# Patient Record
Sex: Male | Born: 1980 | Race: White | Hispanic: No | Marital: Single | Smoking: Current every day smoker
Health system: Southern US, Community
[De-identification: ages and names within clinical notes are randomized; demographics above are authoritative.]

## PROBLEM LIST (undated history)

## (undated) ENCOUNTER — Emergency Department: Admission: EM | Payer: Self-pay | Source: Home / Self Care

## (undated) DIAGNOSIS — B1920 Unspecified viral hepatitis C without hepatic coma: Secondary | ICD-10-CM

## (undated) DIAGNOSIS — F419 Anxiety disorder, unspecified: Secondary | ICD-10-CM

## (undated) HISTORY — DX: Unspecified viral hepatitis C without hepatic coma: B19.20

---

## 2005-08-04 ENCOUNTER — Emergency Department (HOSPITAL_COMMUNITY): Admission: EM | Admit: 2005-08-04 | Discharge: 2005-08-04 | Payer: Self-pay | Admitting: Emergency Medicine

## 2006-08-03 ENCOUNTER — Emergency Department: Payer: Self-pay | Admitting: Emergency Medicine

## 2007-06-09 ENCOUNTER — Emergency Department: Payer: Self-pay | Admitting: Emergency Medicine

## 2007-06-13 ENCOUNTER — Emergency Department: Payer: Self-pay | Admitting: Emergency Medicine

## 2009-05-05 ENCOUNTER — Emergency Department: Payer: Self-pay | Admitting: Emergency Medicine

## 2012-01-10 ENCOUNTER — Emergency Department: Payer: Self-pay | Admitting: Emergency Medicine

## 2013-08-05 ENCOUNTER — Emergency Department: Payer: Self-pay | Admitting: Emergency Medicine

## 2014-06-09 ENCOUNTER — Emergency Department: Payer: Self-pay | Admitting: Emergency Medicine

## 2014-06-09 LAB — DRUG SCREEN, URINE
Amphetamines, Ur Screen: NEGATIVE (ref ?–1000)
Barbiturates, Ur Screen: NEGATIVE (ref ?–200)
Benzodiazepine, Ur Scrn: POSITIVE (ref ?–200)
COCAINE METABOLITE, UR ~~LOC~~: POSITIVE (ref ?–300)
Cannabinoid 50 Ng, Ur ~~LOC~~: POSITIVE (ref ?–50)
MDMA (ECSTASY) UR SCREEN: NEGATIVE (ref ?–500)
Methadone, Ur Screen: NEGATIVE (ref ?–300)
OPIATE, UR SCREEN: POSITIVE (ref ?–300)
Phencyclidine (PCP) Ur S: NEGATIVE (ref ?–25)
TRICYCLIC, UR SCREEN: NEGATIVE (ref ?–1000)

## 2014-06-09 LAB — COMPREHENSIVE METABOLIC PANEL
ALBUMIN: 4 g/dL (ref 3.4–5.0)
ANION GAP: 10 (ref 7–16)
AST: 28 U/L (ref 15–37)
Alkaline Phosphatase: 110 U/L
BUN: 8 mg/dL (ref 7–18)
Bilirubin,Total: 0.4 mg/dL (ref 0.2–1.0)
Calcium, Total: 8.9 mg/dL (ref 8.5–10.1)
Chloride: 105 mmol/L (ref 98–107)
Co2: 21 mmol/L (ref 21–32)
Creatinine: 0.85 mg/dL (ref 0.60–1.30)
EGFR (African American): >60
EGFR (Non-African Amer.): >60
GLUCOSE: 93 mg/dL (ref 65–99)
Osmolality: 270 (ref 275–301)
Potassium: 3.6 mmol/L (ref 3.5–5.1)
SGPT (ALT): 21 U/L (ref 12–78)
Sodium: 136 mmol/L (ref 136–145)
Total Protein: 8 g/dL (ref 6.4–8.2)

## 2014-06-09 LAB — CBC
HCT: 49.6 % (ref 40.0–52.0)
HGB: 17.1 g/dL (ref 13.0–18.0)
MCH: 33 pg (ref 26.0–34.0)
MCHC: 34.6 g/dL (ref 32.0–36.0)
MCV: 95 fL (ref 80–100)
Platelet: 328 10*3/uL (ref 150–440)
RBC: 5.19 10*6/uL (ref 4.40–5.90)
RDW: 13.7 % (ref 11.5–14.5)
WBC: 10.2 10*3/uL (ref 3.8–10.6)

## 2014-06-09 LAB — URINALYSIS, COMPLETE
BILIRUBIN, UR: NEGATIVE
Bacteria: NONE SEEN
Blood: NEGATIVE
GLUCOSE, UR: NEGATIVE mg/dL (ref 0–75)
KETONE: NEGATIVE
Leukocyte Esterase: NEGATIVE
NITRITE: NEGATIVE
PROTEIN: NEGATIVE
Ph: 5 (ref 4.5–8.0)
RBC,UR: <1 /HPF (ref 0–5)
SPECIFIC GRAVITY: 1.016 (ref 1.003–1.030)
Squamous Epithelial: <1

## 2014-06-09 LAB — SALICYLATE LEVEL: Salicylates, Serum: 4.6 mg/dL — ABNORMAL HIGH

## 2014-06-09 LAB — ETHANOL
ETHANOL %: 0.026 % (ref 0.000–0.080)
Ethanol: 26 mg/dL

## 2014-06-09 LAB — ACETAMINOPHEN LEVEL: Acetaminophen: 5 ug/mL — ABNORMAL LOW

## 2015-03-30 ENCOUNTER — Emergency Department
Admit: 2015-03-30 | Disposition: A | Discharge status: Home / Self Care | Payer: Self-pay | Admitting: Emergency Medicine

## 2015-03-30 LAB — COMPREHENSIVE METABOLIC PANEL
ALBUMIN: 4.4 g/dL
ALT: 23 U/L
AST: 24 U/L
Alkaline Phosphatase: 92 U/L
Anion Gap: 10 (ref 7–16)
BILIRUBIN TOTAL: 0.6 mg/dL
BUN: 9 mg/dL
Calcium, Total: 9.5 mg/dL
Chloride: 101 mmol/L
Co2: 26 mmol/L
Creatinine: 0.91 mg/dL
EGFR (African American): >60
EGFR (Non-African Amer.): >60
Glucose: 83 mg/dL
POTASSIUM: 3.5 mmol/L
Sodium: 137 mmol/L
TOTAL PROTEIN: 8.1 g/dL

## 2015-03-30 LAB — CBC
HCT: 51.7 % (ref 40.0–52.0)
HGB: 17.5 g/dL (ref 13.0–18.0)
MCH: 31.5 pg (ref 26.0–34.0)
MCHC: 33.8 g/dL (ref 32.0–36.0)
MCV: 93 fL (ref 80–100)
PLATELETS: 366 10*3/uL (ref 150–440)
RBC: 5.56 10*6/uL (ref 4.40–5.90)
RDW: 13.1 % (ref 11.5–14.5)
WBC: 11.9 10*3/uL — ABNORMAL HIGH (ref 3.8–10.6)

## 2015-03-30 LAB — ACETAMINOPHEN LEVEL

## 2015-03-30 LAB — ETHANOL: Ethanol: <5 mg/dL

## 2015-03-30 LAB — SALICYLATE LEVEL: Salicylates, Serum: <4 mg/dL

## 2015-03-30 LAB — TROPONIN I: Troponin-I: <0.03 ng/mL

## 2016-04-30 ENCOUNTER — Encounter: Payer: Self-pay | Admitting: Medical Oncology

## 2016-04-30 ENCOUNTER — Inpatient Hospital Stay
Admission: RE | Admit: 2016-04-30 | Discharge: 2016-05-03 | DRG: 897 | Disposition: A | Discharge status: Home / Self Care | Payer: Self-pay | Source: Intra-hospital | Attending: Psychiatry | Admitting: Psychiatry

## 2016-04-30 ENCOUNTER — Emergency Department
Admission: EM | Admit: 2016-04-30 | Discharge: 2016-04-30 | Disposition: A | Discharge status: Home / Self Care | Payer: Self-pay | Attending: Emergency Medicine | Admitting: Emergency Medicine

## 2016-04-30 DIAGNOSIS — F1990 Other psychoactive substance use, unspecified, uncomplicated: Secondary | ICD-10-CM | POA: Insufficient documentation

## 2016-04-30 DIAGNOSIS — R45851 Suicidal ideations: Secondary | ICD-10-CM | POA: Insufficient documentation

## 2016-04-30 DIAGNOSIS — F17210 Nicotine dependence, cigarettes, uncomplicated: Secondary | ICD-10-CM | POA: Diagnosis present

## 2016-04-30 DIAGNOSIS — F1410 Cocaine abuse, uncomplicated: Secondary | ICD-10-CM | POA: Insufficient documentation

## 2016-04-30 DIAGNOSIS — F1120 Opioid dependence, uncomplicated: Secondary | ICD-10-CM

## 2016-04-30 DIAGNOSIS — F419 Anxiety disorder, unspecified: Secondary | ICD-10-CM | POA: Diagnosis present

## 2016-04-30 DIAGNOSIS — F1420 Cocaine dependence, uncomplicated: Secondary | ICD-10-CM

## 2016-04-30 DIAGNOSIS — F1424 Cocaine dependence with cocaine-induced mood disorder: Principal | ICD-10-CM | POA: Diagnosis present

## 2016-04-30 DIAGNOSIS — F17200 Nicotine dependence, unspecified, uncomplicated: Secondary | ICD-10-CM

## 2016-04-30 DIAGNOSIS — F3289 Other specified depressive episodes: Secondary | ICD-10-CM

## 2016-04-30 DIAGNOSIS — F1994 Other psychoactive substance use, unspecified with psychoactive substance-induced mood disorder: Secondary | ICD-10-CM

## 2016-04-30 DIAGNOSIS — F1320 Sedative, hypnotic or anxiolytic dependence, uncomplicated: Secondary | ICD-10-CM

## 2016-04-30 DIAGNOSIS — Z818 Family history of other mental and behavioral disorders: Secondary | ICD-10-CM

## 2016-04-30 HISTORY — DX: Anxiety disorder, unspecified: F41.9

## 2016-04-30 LAB — COMPREHENSIVE METABOLIC PANEL
ALK PHOS: 89 U/L (ref 38–126)
ALT: 34 U/L (ref 17–63)
AST: 32 U/L (ref 15–41)
Albumin: 4.6 g/dL (ref 3.5–5.0)
Anion gap: 8 (ref 5–15)
BILIRUBIN TOTAL: 0.6 mg/dL (ref 0.3–1.2)
BUN: 6 mg/dL (ref 6–20)
CALCIUM: 9.3 mg/dL (ref 8.9–10.3)
CO2: 26 mmol/L (ref 22–32)
Chloride: 105 mmol/L (ref 101–111)
Creatinine, Ser: 0.74 mg/dL (ref 0.61–1.24)
GFR calc non Af Amer: >60 mL/min (ref >60)
Glucose, Bld: 90 mg/dL (ref 65–99)
Potassium: 4.3 mmol/L (ref 3.5–5.1)
Sodium: 139 mmol/L (ref 135–145)
TOTAL PROTEIN: 7.6 g/dL (ref 6.5–8.1)

## 2016-04-30 LAB — URINE DRUG SCREEN, QUALITATIVE (ARMC ONLY)
Amphetamines, Ur Screen: NOT DETECTED
BARBITURATES, UR SCREEN: NOT DETECTED
Benzodiazepine, Ur Scrn: POSITIVE — AB
CANNABINOID 50 NG, UR ~~LOC~~: NOT DETECTED
Cocaine Metabolite,Ur ~~LOC~~: POSITIVE — AB
MDMA (Ecstasy)Ur Screen: NOT DETECTED
Methadone Scn, Ur: NOT DETECTED
OPIATE, UR SCREEN: POSITIVE — AB
Phencyclidine (PCP) Ur S: NOT DETECTED
TRICYCLIC, UR SCREEN: NOT DETECTED

## 2016-04-30 LAB — SALICYLATE LEVEL

## 2016-04-30 LAB — CBC
HCT: 44.1 % (ref 40.0–52.0)
Hemoglobin: 14.9 g/dL (ref 13.0–18.0)
MCH: 30.6 pg (ref 26.0–34.0)
MCHC: 33.7 g/dL (ref 32.0–36.0)
MCV: 90.9 fL (ref 80.0–100.0)
Platelets: 280 10*3/uL (ref 150–440)
RBC: 4.85 MIL/uL (ref 4.40–5.90)
RDW: 13.9 % (ref 11.5–14.5)
WBC: 7.7 10*3/uL (ref 3.8–10.6)

## 2016-04-30 LAB — ETHANOL: Alcohol, Ethyl (B): <5 mg/dL (ref <5)

## 2016-04-30 LAB — ACETAMINOPHEN LEVEL: Acetaminophen (Tylenol), Serum: <10 ug/mL — ABNORMAL LOW (ref 10–30)

## 2016-04-30 MED ORDER — ACETAMINOPHEN 325 MG PO TABS
650.0000 mg | ORAL_TABLET | Freq: Four times a day (QID) | ORAL | Status: DC | PRN
  Administered 2016-05-01 – 2016-05-02 (×2): 650 mg via ORAL
  Filled 2016-04-30 (×2): qty 2

## 2016-04-30 MED ORDER — BUSPIRONE HCL 5 MG PO TABS
5.0000 mg | ORAL_TABLET | Freq: Three times a day (TID) | ORAL | Status: DC
  Administered 2016-05-01 – 2016-05-03 (×6): 5 mg via ORAL
  Filled 2016-04-30 (×7): qty 1

## 2016-04-30 MED ORDER — ALUM & MAG HYDROXIDE-SIMETH 200-200-20 MG/5ML PO SUSP: 30.0000 mL | ORAL | Status: DC | PRN

## 2016-04-30 MED ORDER — BUSPIRONE HCL 10 MG PO TABS
5.0000 mg | ORAL_TABLET | Freq: Three times a day (TID) | ORAL | Status: DC
  Administered 2016-04-30: 5 mg via ORAL
  Filled 2016-04-30: qty 2

## 2016-04-30 MED ORDER — MAGNESIUM HYDROXIDE 400 MG/5ML PO SUSP: 30.0000 mL | Freq: Every day | ORAL | Status: DC | PRN

## 2016-04-30 MED ORDER — LORAZEPAM 1 MG PO TABS
1.0000 mg | ORAL_TABLET | Freq: Once | ORAL | Status: AC
  Administered 2016-04-30: 1 mg via ORAL
  Filled 2016-04-30: qty 1

## 2016-04-30 NOTE — ED Provider Notes (Signed)
Time Seen: Approximately *1420 I have reviewed the triage notes  Chief Complaint: Suicidal   History of Present Illness: Richard Pierce is a 35 y.o. male *who states he's been using cocaine now for the last 9 days. He also states marijuana, methamphentermine, alcohol usage, etc. Apparently police were notified after the patient was found unresponsive on a porch. He stated to the police to the officers that he felt suicidal and tells me that his plan was to kill himself by using the multiple substances. His only physical complaint is he feels anxious and jittery. He states last time he used cocaine was this morning. He denies any chest pain or shortness of breath. He states he has had some visual hallucinations while using the polysubstances but none at present. He denies any homicidal thoughts. He denies any previous history of suicidal ideation Past Medical History  Diagnosis Date  . Anxiety     Patient Active Problem List   Diagnosis Date Noted  . Suicidal ideation 04/30/2016  . Substance induced mood disorder (HCC) 04/30/2016  . Cocaine abuse 04/30/2016  . Anxiety disorder 04/30/2016    History reviewed. No pertinent past surgical history.  History reviewed. No pertinent past surgical history.  No current outpatient prescriptions on file.  Allergies:  Review of patient's allergies indicates no known allergies.  Family History: No family history on file.  Social History: Social History  Substance Use Topics  . Smoking status: Current Every Day Smoker  . Smokeless tobacco: None  . Alcohol Use: Yes     Comment: last drink 4 hrs ago     Review of Systems:   10 point review of systems was performed and was otherwise negative:  Constitutional: No fever Eyes: No visual disturbances ENT: No sore throat, ear pain Cardiac: No chest pain Respiratory: No shortness of breath, wheezing, or stridor Abdomen: No abdominal pain, no vomiting, No diarrhea Endocrine: No weight  loss, No night sweats Extremities: No peripheral edema, cyanosis Skin: No rashes, easy bruising Neurologic: No focal weakness, trouble with speech or swollowing Urologic: No dysuria, Hematuria, or urinary frequency *  Physical Exam:  ED Triage Vitals  Enc Vitals Group     BP 04/30/16 1358 137/90 mmHg     Pulse Rate 04/30/16 1358 68     Resp 04/30/16 1358 18     Temp 04/30/16 1358 97.5 F (36.4 C)     Temp Source 04/30/16 1358 Oral     SpO2 04/30/16 1358 99 %     Weight 04/30/16 1358 175 lb (79.379 kg)     Height 04/30/16 1358  (1.626 m)     Head Cir --      Peak Flow --      Pain Score --      Pain Loc --      Pain Edu? --      Excl. in GC? --     General: Awake , Alert , and Oriented times 3; GCS 15 Head: Normal cephalic , atraumatic Eyes: Pupils equal , round, reactive to light Nose/Throat: No nasal drainage, patent upper airway without erythema or exudate.  Neck: Supple, Full range of motion, No anterior adenopathy or palpable thyroid masses Lungs: Clear to ascultation without wheezes , rhonchi, or rales Heart: Regular rate, regular rhythm without murmurs , gallops , or rubs Abdomen: Soft, non tender without rebound, guarding , or rigidity; bowel sounds positive and symmetric in all 4 quadrants. No organomegaly .  Extremities: 2 plus symmetric pulses. No edema, clubbing or cyanosis Neurologic: normal ambulation, Motor symmetric without deficits, sensory intact Skin: warm, dry, no rashes   Labs:   All laboratory work was reviewed including any pertinent negatives or positives listed below:  Labs Reviewed  ACETAMINOPHEN LEVEL - Abnormal; Notable for the following:    Acetaminophen (Tylenol), Serum <10 (*)    All other components within normal limits  URINE DRUG SCREEN, QUALITATIVE ([facility] ONLY) - Abnormal; Notable for the following:    Cocaine Metabolite,Ur [place] POSITIVE (*)    Opiate, Ur Screen POSITIVE (*)    Benzodiazepine, Ur Scrn POSITIVE (*)    All  other components within normal limits  COMPREHENSIVE METABOLIC PANEL  ETHANOL  SALICYLATE LEVEL  CBC  Labs were positive for multiple substances  E   ED Course:  Patient has consultation requested of TTS and psychiatric services. I will involuntarily commit him until cleared by psychiatry. He was given Ativan for his "" jitteriness "" but was cautioned that we would be substituting one substance for another and that we would likely reliable and the psychiatrist for further guidance with medications. Patient appears to be of understanding.    Assessment:  Suicidal ideation Polysubstance abuse      Plan:  Patient has consultation with TTS and psychiatry.            Jennye MoccasinBrian S Graciella Arment, MD 04/30/16 [phone]

## 2016-04-30 NOTE — ED Notes (Signed)
MD at bedside. 

## 2016-04-30 NOTE — ED Notes (Signed)
IVC 

## 2016-04-30 NOTE — Consult Note (Signed)
[facility]iat[facility] Consult   Reason for Consult:  Consult for this 35 year old man brought in by law enforcement taking suicidal statements Referring Physician:  Marcelene Butte Patient Identification: Richard Pierce MRN:  [id number] Principal Diagnosis: Substance induced mood disorder [facility]) Diagnosis:   Patient Active Problem List   Diagnosis Date Noted  . Suicidal ideation [R45.851] 04/30/2016  . Substance induced mood disorder ([place]) [F19.94] 04/30/2016  . Cocaine abuse [F14.10] 04/30/2016  . Anxiety disorder [F41.9] 04/30/2016    Total Time spent with patient: 1 hour  Subjective:   Richard Pierce is a 35 y.o. male patient admitted with "I just feel like giving up".  HPI:  Patient interviewed. Chart reviewed. Labs and vitals reviewed. Case discussed with ER physician and TTS. Patient was brought in by law enforcement after being found passed out on a stranger's porch. Patient has made suicidal statements here in the ER. On interview with me the patient says he is feeling exhausted and feeling like giving up. He says that he has not slept in days and hasn't eaten in several days. His mood feels anxious depressed and hopeless. He has been using crack cocaine and probably other drugs for several days. He can't remember a lot of it. He says that when he got out of jail a couple weeks ago he realized that he had lost everything in his life. Girlfriend was gone, she had taken the children with her, job and home were gone. He got overwhelmed with despair and felt hopeless. He's been on this drug binge for several days. He is not taking psychiatric medicine the way he had in the past. He reports having had some hallucinations while he was intoxicated on cocaine. Patient says that he took 1 Xanax and can't remember any other drugs although there are multiple positive drugs in his screening.  Social history: Not currently working. Hasn't found a job since getting out of jail. Living with his  grandparents. Presents himself as having little social support.  Medical history: Patient denies having any ongoing known medical problems  Substance abuse history: He says he really didn't have much of a problem with drugs until just this last couple weeks. Claims that he hadn't used drugs before going into the jail or at least not in abusive way. He says he used to be prescribed Xanax regularly but was taken off of that while he was in jail. Currently came in with multiple things positive in his drug screen.    Past Psychiatric History: Patient says that for several years he was treated with 2 mg of Xanax 3 times a day starting years ago when he was living in [place]. In jail of course they did not continue the Xanax but instead put him on BuSpar which she also thought was helpful. He describes chronic anxiety and intermittent feelings of panic and feeling overwhelmed easily. Denies ever having tried to kill himself other than this recent allegedly attempted denies any history of psychiatric hospitalization.  Risk to Self: Is patient at risk for suicide?: Yes Risk to Others:   Prior Inpatient Therapy:   Prior Outpatient Therapy:    Past Medical History:  Past Medical History  Diagnosis Date  . Anxiety    History reviewed. No pertinent past surgical history. Family History: No family history on file. Family Psychiatric  History: Patient denies knowing of any family history of mental health or substance abuse problems  Social History:  History  Alcohol Use  . Yes  Comment: last drink 4 hrs ago     History  Drug Use  . Yes  . Special: IV, Cocaine    Social History   Social History  . Marital Status: Single    Spouse Name: N/A  . Number of Children: N/A  . Years of Education: N/A   Social History Main Topics  . Smoking status: Current Every Day Smoker  . Smokeless tobacco: None  . Alcohol Use: Yes     Comment: last drink 4 hrs ago  . Drug Use: Yes    Special: IV, Cocaine   . Sexual Activity: Not Asked   Other Topics Concern  . None   Social History Narrative  . None   Additional Social History:    Allergies:  No Known Allergies  Labs:  Results for orders placed or performed during the hospital encounter of 04/30/16 (from the past 48 hour(s))  Comprehensive metabolic panel     Status: None   Collection Time: 04/30/16  2:04 PM  Result Value Ref Range   Sodium 139 135 - 145 mmol/L   Potassium 4.3 3.5 - 5.1 mmol/L   Chloride 105 101 - 111 mmol/L   CO2 26 22 - 32 mmol/L   Glucose, Bld 90 65 - 99 mg/dL   BUN 6 6 - 20 mg/dL   Creatinine, Ser 0.74 0.61 - 1.24 mg/dL   Calcium 9.3 8.9 - 10.3 mg/dL   Total Protein 7.6 6.5 - 8.1 g/dL   Albumin 4.6 3.5 - 5.0 g/dL   AST 32 15 - 41 U/L   ALT 34 17 - 63 U/L   Alkaline Phosphatase 89 38 - 126 U/L   Total Bilirubin 0.6 0.3 - 1.2 mg/dL   GFR calc non Af Amer >60 >60 mL/min   GFR calc Af Amer >60 >60 mL/min    Comment: (NOTE) The eGFR has been calculated using the CKD EPI equation. This calculation has not been validated in all clinical situations. eGFR's persistently <60 mL/min signify possible Chronic Kidney Disease.    Anion gap 8 5 - 15  Ethanol     Status: None   Collection Time: 04/30/16  2:04 PM  Result Value Ref Range   Alcohol, Ethyl (B) <5 <5 mg/dL    Comment:        LOWEST DETECTABLE LIMIT FOR SERUM ALCOHOL IS 5 mg/dL FOR MEDICAL PURPOSES ONLY   Salicylate level     Status: None   Collection Time: 04/30/16  2:04 PM  Result Value Ref Range   Salicylate Lvl <2.1 2.8 - 30.0 mg/dL  Acetaminophen level     Status: Abnormal   Collection Time: 04/30/16  2:04 PM  Result Value Ref Range   Acetaminophen (Tylenol), Serum <10 (L) 10 - 30 ug/mL    Comment:        THERAPEUTIC CONCENTRATIONS VARY SIGNIFICANTLY. A RANGE OF 10-30 ug/mL MAY BE AN EFFECTIVE CONCENTRATION FOR MANY PATIENTS. HOWEVER, SOME ARE BEST TREATED AT CONCENTRATIONS OUTSIDE THIS RANGE. ACETAMINOPHEN CONCENTRATIONS >150  ug/mL AT 4 HOURS AFTER INGESTION AND >50 ug/mL AT 12 HOURS AFTER INGESTION ARE OFTEN ASSOCIATED WITH TOXIC REACTIONS.   cbc     Status: None   Collection Time: 04/30/16  2:04 PM  Result Value Ref Range   WBC 7.7 3.8 - 10.6 K/uL   RBC 4.85 4.40 - 5.90 MIL/uL   Hemoglobin 14.9 13.0 - 18.0 g/dL   HCT 44.1 40.0 - 52.0 %   MCV 90.9 80.0 - 100.0 fL  MCH 30.6 26.0 - 34.0 pg   MCHC 33.7 32.0 - 36.0 g/dL   RDW 13.9 11.5 - 14.5 %   Platelets 280 150 - 440 K/uL  Urine Drug Screen, Qualitative     Status: Abnormal   Collection Time: 04/30/16  2:04 PM  Result Value Ref Range   Tricyclic, Ur Screen NONE DETECTED NONE DETECTED   Amphetamines, Ur Screen NONE DETECTED NONE DETECTED   MDMA (Ecstasy)Ur Screen NONE DETECTED NONE DETECTED   Cocaine Metabolite,Ur [place] POSITIVE (A) NONE DETECTED   Opiate, Ur Screen POSITIVE (A) NONE DETECTED   Phencyclidine (PCP) Ur S NONE DETECTED NONE DETECTED   Cannabinoid 50 Ng, Ur [place] NONE DETECTED NONE DETECTED   Barbiturates, Ur Screen NONE DETECTED NONE DETECTED   Benzodiazepine, Ur Scrn POSITIVE (A) NONE DETECTED   Methadone Scn, Ur NONE DETECTED NONE DETECTED    Comment: (NOTE) 220  Tricyclics, urine               Cutoff 1000 ng/mL 200  Amphetamines, urine             Cutoff 1000 ng/mL 300  MDMA (Ecstasy), urine           Cutoff 500 ng/mL 400  Cocaine Metabolite, urine       Cutoff 300 ng/mL 500  Opiate, urine                   Cutoff 300 ng/mL 600  Phencyclidine (PCP), urine      Cutoff 25 ng/mL 700  Cannabinoid, urine              Cutoff 50 ng/mL 800  Barbiturates, urine             Cutoff 200 ng/mL 900  Benzodiazepine, urine           Cutoff 200 ng/mL 1000 Methadone, urine                Cutoff 300 ng/mL 1100 1200 The urine drug screen provides only a preliminary, unconfirmed 1300 analytical test result and should not be used for non-medical 1400 purposes. Clinical consideration and professional judgment should 1500 be applied to any positive drug  screen result due to possible 1600 interfering substances. A more specific alternate chemical method 1700 must be used in order to obtain a confirmed analytical result.  1800 Gas chromato graphy / mass spectrometry (GC/MS) is the preferred 1900 confirmatory method.     Current Facility-Administered Medications  Medication Dose Route Frequency Provider Last Rate Last Dose  . busPIRone (BUSPAR) tablet 5 mg  5 mg Oral TID Gonzella Lex, MD       No current outpatient prescriptions on file.    Musculoskeletal: Strength & Muscle Tone: within normal limits Gait & Station: normal Patient leans: N/A  Psychiatric Specialty Exam: Review of Systems  Constitutional: Positive for malaise/fatigue.  HENT: Negative.   Eyes: Negative.   Respiratory: Negative.   Cardiovascular: Negative.   Gastrointestinal: Negative.   Musculoskeletal: Positive for myalgias.  Skin: Negative.   Neurological: Negative.   Psychiatric/Behavioral: Positive for depression, suicidal ideas, hallucinations, memory loss and substance abuse. The patient is nervous/anxious and has insomnia.     Blood pressure 137/90, pulse 68, temperature 97.5 F (36.4 C), temperature source Oral, resp. rate 18, height _0  (1.626 m), weight 79.379 kg (175 lb), SpO2 99 %.Body mass index is 30.02 kg/(m^2).  General Appearance: Disheveled  Eye Contact::  Minimal  Speech:  Slow and Slurred  Volume:  Decreased  Mood:  Depressed  Affect:  Depressed  Thought Process:  Tangential  Orientation:  Full (Time, Place, and Person)  Thought Content:  Negative  Suicidal Thoughts:  Yes.  with intent/plan  Homicidal Thoughts:  No  Memory:  Immediate;   Fair Recent;   Poor Remote;   Fair  Judgement:  Impaired  Insight:  Fair  Psychomotor Activity:  Decreased  Concentration:  Poor  Recall:  Poor  Fund of Knowledge:Fair  Language: Fair  Akathisia:  No  Handed:  Right  AIMS (if indicated):     Assets:  Desire for Improvement Physical  Health  ADL's:  Intact  Cognition: Impaired,  Mild  Sleep:      Treatment Plan Summary: Daily contact with patient to assess and evaluate symptoms and progress in treatment, Medication management and Plan 35 year old man who comes in to the hospital intoxicated but also with suicidal ideation. I was able to wake him up enough to have a conversation and he does endorse feeling sad and negative much of the time. Hopeless. Wishing he were dead. Not able to really think of a clear plan for the future. Lots of major psychological stresses on him. I will go ahead and admit him to the hospital. Restart BuSpar 5 mg 3 times a day for anxiety. Continuous observation for now for suicidal ideation. Try and engage him in appropriate therapy groups and get a better assessment of him once he is sober and away  Disposition: Recommend psychiatric Inpatient admission when medically cleared. Supportive therapy provided about ongoing stressors.  Alethia Berthold, MD 04/30/2016 3:59 PM

## 2016-04-30 NOTE — BH Assessment (Signed)
Assessment Note  Richard Pierce is an 35 y.o. male Who presents to the ER due to depression and suicide attempt. He states he took "a bunch of drugs" with the hopes of overdosing and ending his life. Patient was recently incarcerated and was released two weeks ago. Since then, he hasn't worked and hasn't had stable housing. Thus, causing his depression to worsen. Thus, the suicide attempt on last night was a result of his current situation.  During the interview, the patient was drowsy and lethargic. Writer had to wake him up several times, in order to complete the assessment.  Patient continue to endorse SI. He denies HI and AV/H.   Past Medical History:  Past Medical History  Diagnosis Date  . Anxiety     History reviewed. No pertinent past surgical history.  Family History: No family history on file.  Social History:  reports that he has been smoking.  He does not have any smokeless tobacco history on file. He reports that he drinks alcohol. He reports that he uses illicit drugs (IV and Cocaine).  Additional Social History:  Alcohol / Drug Use Pain Medications: See PTA Prescriptions: See PTA Over the Counter: See PTA History of alcohol / drug use?: Yes Longest period of sobriety (when/how long): Unknown Negative Consequences of Use: Personal relationships, Legal, Financial Withdrawal Symptoms:  (Uknown at this time)  CIWA: CIWA-Ar BP: 137/90 mmHg Pulse Rate: 68 COWS:    Allergies: No Known Allergies  Home Medications:  (Not in a hospital admission)  OB/GYN Status:  No LMP for male patient.  General Assessment Data Location of Assessment: [facility] ED TTS Assessment: In system Is this a Tele or Face-to-Face Assessment?: Face-to-Face Is this an Initial Assessment or a Re-assessment for this encounter?: Initial Assessment Marital status: Single Maiden name: n/a Is patient pregnant?: No Pregnancy Status: No Living Arrangements: Other (Comment) (Homeless) Can pt return  to current living arrangement?: Yes Admission Status: Voluntary Is patient capable of signing voluntary admission?: Yes Referral Source: Self/Family/Friend Insurance type: n/a  Medical Screening Exam [facility] Walk-in ONLY) Medical Exam completed: Yes  Crisis Care Plan Living Arrangements: Other (Comment) (Homeless) Legal Guardian: Other: (None) Name of Psychiatrist: Reports of none Name of Therapist: Reports of none  Education Status Is patient currently in school?: No Current Grade: n/a Highest grade of school patient has completed: Unknown Name of school: n/a Contact person: n/a  Risk to self with the past 6 months Suicidal Ideation: Yes-Currently Present Has patient been a risk to self within the past 6 months prior to admission? : Yes Suicidal Intent: Yes-Currently Present Has patient had any suicidal intent within the past 6 months prior to admission? : Yes Is patient at risk for suicide?: Yes Suicidal Plan?: Yes-Currently Present Has patient had any suicidal plan within the past 6 months prior to admission? : Yes Specify Current Suicidal Plan: Overdose Access to Means: Yes Specify Access to Suicidal Means: Drugs What has been your use of drugs/alcohol within the last 12 months?: Cocaine, Benzo's and Opioids Previous Attempts/Gestures: Yes How many times?: 1 Other Self Harm Risks: Active Addiction Triggers for Past Attempts: None known Intentional Self Injurious Behavior: None Family Suicide History: No Recent stressful life event(s): Other (Comment), Conflict (Comment), Financial Problems, Job Loss (Active Addiction ) Persecutory voices/beliefs?: No Depression: Yes Depression Symptoms: Feeling angry/irritable, Feeling worthless/self pity, Loss of interest in usual pleasures, Isolating, Guilt, Fatigue Substance abuse history and/or treatment for substance abuse?: Yes Suicide prevention information given to non-admitted patients: Not  applicable  Risk to Others within  the past 6 months Homicidal Ideation: No Does patient have any lifetime risk of violence toward others beyond the six months prior to admission? : No Thoughts of Harm to Others: No Current Homicidal Intent: No Current Homicidal Plan: No Access to Homicidal Means: No Identified Victim: Reports of none History of harm to others?: No Assessment of Violence: None Noted Violent Behavior Description: Reports of none Does patient have access to weapons?: No Criminal Charges Pending?: No Does patient have a court date: No Is patient on probation?: No  Psychosis Hallucinations: None noted Delusions: None noted  Mental Status Report Appearance/Hygiene: Unremarkable, In scrubs, In hospital gown Eye Contact: Fair Motor Activity: Freedom of movement, Unremarkable Speech: Logical/coherent, Unremarkable Level of Consciousness: Drowsy Mood: Depressed, Anxious, Sad, Helpless, Pleasant Affect: Sad, Depressed, Appropriate to circumstance Anxiety Level: Minimal Thought Processes: Coherent, Relevant Judgement: Impaired Orientation: Place, Person, Situation, Time, Appropriate for developmental age Obsessive Compulsive Thoughts/Behaviors: None  Cognitive Functioning Concentration: Decreased Memory: Recent Intact, Remote Intact IQ: Average Insight: Fair Impulse Control: Poor Appetite: Fair Weight Loss: 10 Weight Gain: 0 Sleep: No Change Total Hours of Sleep: 5 Vegetative Symptoms: None  ADLScreening [facility] Assessment Services) Patient's cognitive ability adequate to safely complete daily activities?: Yes Patient able to express need for assistance with ADLs?: Yes Independently performs ADLs?: Yes (appropriate for developmental age)  Prior Inpatient Therapy Prior Inpatient Therapy: No Prior Therapy Dates: Reports of none Prior Therapy Facilty/Provider(s): Reports of none Reason for Treatment: Reports of none  Prior Outpatient Therapy Prior Outpatient Therapy: No Prior Therapy Dates:  Reports of none Prior Therapy Facilty/Provider(s): Reports of none Reason for Treatment: Reports of none Does patient have an ACCT team?: No Does patient have Intensive In-House Services?  : No Does patient have [facility] services? : No Does patient have P4CC services?: No  ADL Screening (condition at time of admission) Patient's cognitive ability adequate to safely complete daily activities?: Yes Is the patient deaf or have difficulty hearing?: No Does the patient have difficulty seeing, even when wearing glasses/contacts?: No Does the patient have difficulty concentrating, remembering, or making decisions?: No Patient able to express need for assistance with ADLs?: Yes Does the patient have difficulty dressing or bathing?: No Independently performs ADLs?: Yes (appropriate for developmental age) Does the patient have difficulty walking or climbing stairs?: No Weakness of Legs: None Weakness of Arms/Hands: None  Home Assistive Devices/Equipment Home Assistive Devices/Equipment: None  Therapy Consults (therapy consults require a physician order) PT Evaluation Needed: No OT Evalulation Needed: No SLP Evaluation Needed: No Abuse/Neglect Assessment (Assessment to be complete while patient is alone) Physical Abuse: Denies Verbal Abuse: Denies Sexual Abuse: Denies Exploitation of patient/patient's resources: Denies Self-Neglect: Denies Values / Beliefs Cultural Requests During Hospitalization: None Spiritual Requests During Hospitalization: None Consults Spiritual Care Consult Needed: No Social Work Consult Needed: No      Additional Information 1:1 In Past 12 Months?: No CIRT Risk: No Elopement Risk: No Does patient have medical clearance?: Yes  Child/Adolescent Assessment Running Away Risk: Denies (Patient is an adult)  Disposition:  Disposition Initial Assessment Completed for this Encounter: Yes Disposition of Patient: Other dispositions (ER MD Ordered Psych  Consult) Other disposition(s): Other (Comment) (ER MD Ordered Psych Consult)  On Site Evaluation by:   Reviewed with Physician:    Lilyan Gilfordalvin J. Lillian Ballester MS, LCAS, LPC, NCC, CCSI Therapeutic Triage Specialist 04/30/2016 4:35 PM

## 2016-04-30 NOTE — ED Notes (Signed)
Pt reports he has been using cocaine x 9 days with intents to kill himself. Pt also reports he has been drinking and taking pills. Pt denies HI.

## 2016-04-30 NOTE — [facility] Counselor (Signed)
Patient has been accepted [facility]. Accepting Physician is Dr. Toni Amendlapacs. Attending physician will be Dr. Ardyth HarpsHernandez. [facility] Nurse [place] Call report to [phone]

## 2016-05-01 DIAGNOSIS — F1994 Other psychoactive substance use, unspecified with psychoactive substance-induced mood disorder: Secondary | ICD-10-CM

## 2016-05-01 NOTE — [facility] Group Notes (Signed)
[facility] LCSW Group Therapy  05/01/2016 2:14 PM  Type of Therapy:  Group Therapy  Participation Level:  Did Not Attend  Modes of Intervention:  Discussion, Education, Socialization and Support  Summary of Progress/Problems: Balance in life: Patients will discuss the concept of balance and how it looks and feels to be unbalanced. Pt will identify areas in their life that is unbalanced and ways to become more balanced.    Assia Meanor L Hibo Blasdell MSW, LCSWA  05/01/2016, 2:14 PM  

## 2016-05-01 NOTE — Plan of Care (Signed)
Problem: Diagnosis: Increased Risk For Suicide Attempt Goal: LTG-Patient Will Report Improved Mood and Deny Suicidal LTG (by discharge) Patient will report improved mood and deny suicidal ideation.  Outcome: Progressing Patient denies SI     

## 2016-05-01 NOTE — [facility] Counselor (Signed)
Adult Comprehensive Assessment  Patient ID: Richard Pierce, male   DOB: 07-May-1981, 35 y.o.   MRN: [id number]  Information Source: Information source: Patient  Current Stressors:  Educational / Learning stressors: None reported  Employment / Job issues: Pt is currently unemployed  Family Relationships: Strained relationship with family.  Financial / Lack of resources (include bankruptcy): No income.  Housing / Lack of housing: Pt is currently homeless.  Physical health (include injuries & life threatening diseases): None reported  Social relationships: None reported  Substance abuse: Pt reports using $400-$500 worth of Cocaine per day along with a 5th of liquor and 18 beers.   Living/Environment/Situation:  Living Arrangements: Other relatives (Grandmother ) Living conditions (as described by patient or guardian): Pt reports he cannot return.  How long has patient lived in current situation?: 3 weeks. Prior to this he was in prison.  What is atmosphere in current home: Temporary, Chaotic  Family History:  Marital status: Single Are you sexually active?: Yes What is your sexual orientation?: Heterosexual  Has your sexual activity been affected by drugs, alcohol, medication, or emotional stress?: None reported  Does patient have children?: Yes How many children?: 2 How is patient's relationship with their children?: son and daugher; close relationship.   Childhood History:  By whom was/is the patient raised?: Other (Comment) (Great grand parents ) Additional childhood history information: Parents were not involved in childhood.  Description of patient's relationship with caregiver when they were a child: Close relationship with great grandparents  Patient's description of current relationship with people who raised him/her: [place] and parents have passed away.  How were you disciplined when you got in trouble as a child/adolescent?: None reported  Does patient have  siblings?: Yes Number of Siblings: 1 Description of patient's current relationship with siblings: Brother, no contact  Did patient suffer any verbal/emotional/physical/sexual abuse as a child?: No Did patient suffer from severe childhood neglect?: No Has patient ever been sexually abused/assaulted/raped as an adolescent or adult?: No Was the patient ever a victim of a crime or a disaster?: No Witnessed domestic violence?: No Has patient been effected by domestic violence as an adult?: No  Education:  Highest grade of school patient has completed: High school  Currently a student?: No Learning disability?: No  Employment/Work Situation:   Employment situation: Unemployed Patient's job has been impacted by current illness: No What is the longest time patient has a held a job?: "year"  Where was the patient employed at that time?: "I dont know. I have had a few jobs for years."  Has patient ever been in the [employer] and Companymilitary?: No  Financial Resources:   Financial resources: No income Does patient have a Lawyerrepresentative payee or guardian?: No  Alcohol/Substance Abuse:   What has been your use of drugs/alcohol within the last 12 months?: Pt reports alcohol and cocaine use.  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   Discharge Plan:   Does  patient have access to transportation?:  Yes Will patient be returning to same living situation after discharge?: No Plan for living situation after discharge: CSW assessing  Currently receiving community mental health services: No If no, would patient like referral for services when discharged?: Yes (What [place] ) Does patient have financial barriers related to discharge medications?: Yes Patient description of barriers related to discharge medications: No insurance, no income   Summary/Recommendations:    Patient is a 35 year old male admitted  with a diagnosis of substance induced mood disorder. Patient presented to the hospital with substance abuse, depression, and suicidal ideation. Patient reports primary triggers for admission were substance abuse and lack of sleep. Pt reports he was waiting for a friend to come home and fell asleep on the porch. Prior to falling asleep, he was arguing with a woman who wanted in the house. A neighbor called the police. The police took him to the ED. Pt reports he was very intoxicated and had not slept in "9-10 days." Pt reports he was drinking 18 beers and a 5th of liqior per day, along with $400-500 worth of cocaine. He is interested in inpatient substance abuse treatment. He cannot return home with grandmother. He does not currently have an outpatient provider. Patient will benefit from crisis stabilization, medication evaluation, group therapy and psycho education in addition to case management for discharge. At discharge, it is recommended that patient remain compliant with established discharge plan and continued treatment.   Richard Pierce. MSW, [facility]  05/01/2016

## 2016-05-01 NOTE — Tx Team (Signed)
Initial Interdisciplinary Treatment Plan   PATIENT STRESSORS: Marital or family conflict Medication change or noncompliance Substance abuse   PATIENT STRENGTHS: Physical Health Supportive family/friends   PROBLEM LIST: Problem List/Patient Goals Date to be addressed Date deferred Reason deferred Estimated date of resolution  Suicidal ideation  04/30/16           Substance induced mood disorder.  04/30/16           Anxiety 04/30/16                              DISCHARGE CRITERIA:  Improved stabilization in mood, thinking, and/or behavior Medical problems require only outpatient monitoring Motivation to continue treatment in a less acute level of care  PRELIMINARY DISCHARGE PLAN: Attend 12-step recovery group Outpatient therapy Participate in family therapy  PATIENT/FAMIILY INVOLVEMENT: This treatment plan has been presented to and reviewed with the patient, Richard Pierce,The patient and family have been given the opportunity to ask questions and make suggestions.  [name] [name] [name] 05/01/2016, 2:04 AM

## 2016-05-01 NOTE — Progress Notes (Signed)
Pt has been seclusive to his room. Pt refused his AM dose of buspar stating" I told them that buspar makes me sick. I can't take it". Pt requested xanax instead. Pt noted to be slightly irritable . Pt did not attend any unit activities.

## 2016-05-01 NOTE — [facility] Suicide Risk Assessment (Signed)
[facility]   Nursing information obtained from:  Patient Demographic factors:  Caucasian, Low socioeconomic status, Unemployed, Male Current Mental Status:  NA Loss Factors:  Loss of significant relationship, Financial problems / change in socioeconomic status Historical Factors:  Impulsivity Risk Reduction Factors:  Sense of responsibility to family, Living with another person, especially a relative  Total Time spent with patient: 1 hour Principal Problem: Substance induced mood disorder (HCC) Diagnosis:   Patient Active Problem List   Diagnosis Date Noted  . Suicidal ideation [R45.851] 04/30/2016  . Substance induced mood disorder (HCC) [F19.94] 04/30/2016  . Cocaine abuse [F14.10] 04/30/2016  . Anxiety disorder [F41.9] 04/30/2016   Subjective Data: Patient today is denying suicidal ideation. Denies any hallucinations. Says that he has plans to try and get a job when he gets out of the hospital. He remains withdrawn and emotionally flat and still feeling physically run down.  Continued Clinical Symptoms:  Alcohol Use Disorder Identification Test Final Score (AUDIT): 28 The "Alcohol Use Disorders Identification Test", Guidelines for Use in Primary Care, Second Edition.  World Science writerHealth Organization [facility](WHO). Score between 0-7:  no or low risk or alcohol related problems. Score between 8-15:  moderate risk of alcohol related problems. Score between 16-19:  high risk of alcohol related problems. Score 20 or above:  warrants further diagnostic evaluation for alcohol dependence and treatment.   CLINICAL FACTORS:   Depression:   Anhedonia Alcohol/Substance Abuse/Dependencies   Musculoskeletal: Strength & Muscle Tone: within normal limits Gait & Station: normal Patient leans: N/A  Psychiatric Specialty Exam: ROS  Blood pressure 132/92, pulse 62, temperature 98 F (36.7 C), temperature source Oral, resp. rate 20, height 5\' 4"  (1.626 m), weight 79.379 kg (175  lb).Body mass index is 30.02 kg/(m^2).  General Appearance: Disheveled  Eye Contact::  Minimal  Speech:  Slow  Volume:  Decreased  Mood:  Dysphoric  Affect:  Flat  Thought Process:  Goal Directed  Orientation:  Full (Time, Place, and Person)  Thought Content:  Negative  Suicidal Thoughts:  No  Homicidal Thoughts:  No  Memory:  Immediate;   Fair Recent;   Fair Remote;   Fair  Judgement:  Impaired  Insight:  Shallow  Psychomotor Activity:  Decreased  Concentration:  Fair  Recall:  F[employer]Fair  Fund of Knowledge:Fair  Language: Fair  Akathisia:  No  Handed:  Right  AIMS (if indicated):     Assets:  Desire for Improvement Housing Physical Health Resilience  Sleep:  Number of Hours: 8  Cognition: WNL  ADL's:  Impaired    COGNITIVE FEATURES THAT CONTRIBUTE TO RISK:  Loss of executive function    SUICIDE RISK:   Mild:  Suicidal ideation of limited frequency, intensity, duration, and specificity.  There are no identifiable plans, no associated intent, mild dysphoria and related symptoms, good self-control (both objective and subjective assessment), few other risk factors, and identifiable protective factors, including available and accessible social support.  PLAN OF CARE: Patient will be on continuous observation for now. Engage in group and individual treatment on the unit. Nursing evaluation will be completed as well as social work evaluation. Ongoing daily evaluations of mood symptoms. Work on planning for outpatient treatment in the community. He has been offered appropriate medicine which she is at this point refusing.  I certify that inpatient services furnished can reasonably be expected to improve the patient's condition.   Mordecai RasmussenJohn Dameshia Seybold, MD 05/01/2016, 4:05 PM

## 2016-05-01 NOTE — Progress Notes (Addendum)
Admission Note:  89yr male who presents IVC in no acute distress for the treatment of SI, Depression, Anxiety and Substance abuse.  Patient's affect is flat, sad and depressed. Patient's mood is irritable, argumentative, and unwilling to participate in treatment plan and admission process. Pt denies S/HI and contracts for safety upon admission. Pt denies AVH. Pt  has Past medical hx of depression. Pt was recently released from jail. Skin was assessed and found to be clear of any abnormal marks,skin warm, dry and intact. PT searched and no contraband found, POC and unit policies explained and understanding verbalized. Consents obtained. Food and fluids offered, and it was accepted. Pt is not receptive to treatment,15 minutes checks maintained, will continue to monitor.

## 2016-05-01 NOTE — H&P (Signed)
Psychiatric Admission Assessment Adult  Patient Identification: Richard Pierce MRN:  [id number] Date of Evaluation:  05/01/2016 Chief Complaint:  depression Principal Diagnosis: Substance induced mood disorder ([place]) Diagnosis:   Patient Active Problem List   Diagnosis Date Noted  . Suicidal ideation [R45.851] 04/30/2016  . Substance induced mood disorder ([place]) [F19.94] 04/30/2016  . Cocaine abuse [F14.10] 04/30/2016  . Anxiety disorder [F41.9] 04/30/2016   History of Present Illness:: 35 year old man admitted through the emergency room where he presented yesterday. Patient was taking suicidal statements feeling like he was going to do something to kill himself. Feeling like his heavy drug abuse had been partially suicidal. Mood is been depressed and anxious for many days. Hadn't been sleeping in days hadn't been eating in days. Had been on a heavy binge of cocaine and probably other drugs. Mood feeling hopeless and helpless and overwhelmed. Not currently on any psychiatric medicine or in any psychiatric treatment. Major stress just recently having been released from jail having no really good stable place to stay no job no family support. Associated Signs/Symptoms: Depression Symptoms:  depressed mood, anhedonia, hypersomnia, psychomotor retardation, fatigue, feelings of worthlessness/guilt, difficulty concentrating, hopelessness, suicidal thoughts with specific plan, loss of energy/fatigue, disturbed sleep, (Hypo) Manic Symptoms:  Distractibility, Anxiety Symptoms:  Excessive Worry, Social Anxiety, Psychotic Symptoms:  Paranoia, PTSD Symptoms: Negative Total Time spent with patient: 1 hour  Past Psychiatric History: Patient states he had been treated for anxiety in the past by an outpatient provider and used to take Xanax 2 mg 3 times a day. This was discontinued over a year ago when he was in jail. Was treated with BuSpar while in jail. He told me last night that the BuSpar had  been helpful to him but today he is refusing to take it saying that it makes him sick to his stomach. Denies any prior suicide attempts. Denies being aware of any other medications he has taken in the past.  Is the patient at risk to self? Yes.    Has the patient been a risk to self in the past 6 months? Yes.    Has the patient been a risk to self within the distant past? No.  Is the patient a risk to others? No.  Has the patient been a risk to others in the past 6 months? No.  Has the patient been a risk to others within the distant past? No.   Prior Inpatient Therapy:  no prior inpatient admissions that he can recall Prior Outpatient Therapy:  used to be on medicine but doesn't recall specific therapy  Alcohol Screening: 1. How often do you have a drink containing alcohol?: 4 or more times a week 2. How many drinks containing alcohol do you have on a typical day when you are drinking?: 7, 8, or 9 3. How often do you have six or more drinks on one occasion?: Daily or almost daily Preliminary Score: 7 4. How often during the last year have you found that you were not able to stop drinking once you had started?: Daily or almost daily 5. How often during the last year have you failed to do what was normally expected from you becasue of drinking?: Weekly 6. How often during the last year have you needed a first drink in the morning to get yourself going after a heavy drinking session?: Weekly 8. How often during the last year have you been unable to remember what happened the night before because you had been drinking?: Weekly  9. Have you or someone else been injured as a result of your drinking?: No 10. Has a relative or friend or a doctor or another health worker been concerned about your drinking or suggested you cut down?: Yes, during the last year Alcohol Use Disorder Identification Test Final Score (AUDIT): 28 Brief Intervention: Patient declined brief intervention Substance Abuse History  in the last 12 months:  Yes.   Consequences of Substance Abuse: Legal Consequences:  Jail time Family Consequences:  Loss of family contact Previous Psychotropic Medications: Yes  Psychological Evaluations: Yes  Past Medical History:  Past Medical History  Diagnosis Date  . Anxiety    History reviewed. No pertinent past surgical history. Family History: History reviewed. No pertinent family history. Family Psychiatric  History: Patient denies family history Tobacco Screening: @FLOW ([phone])::1)@ Social History:  History  Alcohol Use  . 4.8 oz/week  . 8 Cans of beer per week    Comment: last drink 4 hrs ago     History  Drug Use  . Yes  . Special: IV, Cocaine    Additional Social History: Marital status: Single Are you sexually active?: Yes What is your sexual orientation?: Heterosexual  Has your sexual activity been affected by drugs, alcohol, medication, or emotional stress?: None reported  Does patient have children?: Yes How many children?: 2 How is patient's relationship with their children?: son and daugher; close relationship.                          Allergies:  No Known Allergies Lab Results:  Results for orders placed or performed during the hospital encounter of 04/30/16 (from the past 48 hour(s))  Comprehensive metabolic panel     Status: None   Collection Time: 04/30/16  2:04 PM  Result Value Ref Range   Sodium 139 135 - 145 mmol/L   Potassium 4.3 3.5 - 5.1 mmol/L   Chloride 105 101 - 111 mmol/L   CO2 26 22 - 32 mmol/L   Glucose, Bld 90 65 - 99 mg/dL   BUN 6 6 - 20 mg/dL   Creatinine, Ser 0.74 0.61 - 1.24 mg/dL   Calcium 9.3 8.9 - 10.3 mg/dL   Total Protein 7.6 6.5 - 8.1 g/dL   Albumin 4.6 3.5 - 5.0 g/dL   AST 32 15 - 41 U/L   ALT 34 17 - 63 U/L   Alkaline Phosphatase 89 38 - 126 U/L   Total Bilirubin 0.6 0.3 - 1.2 mg/dL   GFR calc non Af Amer >60 >60 mL/min   GFR calc Af Amer >60 >60 mL/min    Comment: (NOTE) The eGFR has been  calculated using the CKD EPI equation. This calculation has not been validated in all clinical situations. eGFR's persistently <60 mL/min signify possible Chronic Kidney Disease.    Anion gap 8 5 - 15  Ethanol     Status: None   Collection Time: 04/30/16  2:04 PM  Result Value Ref Range   Alcohol, Ethyl (B) <5 <5 mg/dL    Comment:        LOWEST DETECTABLE LIMIT FOR SERUM ALCOHOL IS 5 mg/dL FOR MEDICAL PURPOSES ONLY   Salicylate level     Status: None   Collection Time: 04/30/16  2:04 PM  Result Value Ref Range   Salicylate Lvl <2.5 2.8 - 30.0 mg/dL  Acetaminophen level     Status: Abnormal   Collection Time: 04/30/16  2:04 PM  Result Value Ref Range  Acetaminophen (Tylenol), Serum <10 (L) 10 - 30 ug/mL    Comment:        THERAPEUTIC CONCENTRATIONS VARY SIGNIFICANTLY. A RANGE OF 10-30 ug/mL MAY BE AN EFFECTIVE CONCENTRATION FOR MANY PATIENTS. HOWEVER, SOME ARE BEST TREATED AT CONCENTRATIONS OUTSIDE THIS RANGE. ACETAMINOPHEN CONCENTRATIONS >150 ug/mL AT 4 HOURS AFTER INGESTION AND >50 ug/mL AT 12 HOURS AFTER INGESTION ARE OFTEN ASSOCIATED WITH TOXIC REACTIONS.   cbc     Status: None   Collection Time: 04/30/16  2:04 PM  Result Value Ref Range   WBC 7.7 3.8 - 10.6 K/uL   RBC 4.85 4.40 - 5.90 MIL/uL   Hemoglobin 14.9 13.0 - 18.0 g/dL   HCT 44.1 40.0 - 52.0 %   MCV 90.9 80.0 - 100.0 fL   MCH 30.6 26.0 - 34.0 pg   MCHC 33.7 32.0 - 36.0 g/dL   RDW 13.9 11.5 - 14.5 %   Platelets 280 150 - 440 K/uL  Urine Drug Screen, Qualitative     Status: Abnormal   Collection Time: 04/30/16  2:04 PM  Result Value Ref Range   Tricyclic, Ur Screen NONE DETECTED NONE DETECTED   Amphetamines, Ur Screen NONE DETECTED NONE DETECTED   MDMA (Ecstasy)Ur Screen NONE DETECTED NONE DETECTED   Cocaine Metabolite,Ur [place] POSITIVE (A) NONE DETECTED   Opiate, Ur Screen POSITIVE (A) NONE DETECTED   Phencyclidine (PCP) Ur S NONE DETECTED NONE DETECTED   Cannabinoid 50 Ng, Ur [place] NONE DETECTED NONE  DETECTED   Barbiturates, Ur Screen NONE DETECTED NONE DETECTED   Benzodiazepine, Ur Scrn POSITIVE (A) NONE DETECTED   Methadone Scn, Ur NONE DETECTED NONE DETECTED    Comment: (NOTE) 737  Tricyclics, urine               Cutoff 1000 ng/mL 200  Amphetamines, urine             Cutoff 1000 ng/mL 300  MDMA (Ecstasy), urine           Cutoff 500 ng/mL 400  Cocaine Metabolite, urine       Cutoff 300 ng/mL 500  Opiate, urine                   Cutoff 300 ng/mL 600  Phencyclidine (PCP), urine      Cutoff 25 ng/mL 700  Cannabinoid, urine              Cutoff 50 ng/mL 800  Barbiturates, urine             Cutoff 200 ng/mL 900  Benzodiazepine, urine           Cutoff 200 ng/mL 1000 Methadone, urine                Cutoff 300 ng/mL 1100 1200 The urine drug screen provides only a preliminary, unconfirmed 1300 analytical test result and should not be used for non-medical 1400 purposes. Clinical consideration and professional judgment should 1500 be applied to any positive drug screen result due to possible 1600 interfering substances. A more specific alternate chemical method 1700 must be used in order to obtain a confirmed analytical result.  1800 Gas chromato graphy / mass spectrometry (GC/MS) is the preferred 1900 confirmatory method.     Blood Alcohol level:  Lab Results  Component Value Date   ETH <5 10/62/6948    Metabolic Disorder Labs:  No results found for: HGBA1C, MPG No results found for: PROLACTIN No results found for: CHOL, TRIG, HDL, CHOLHDL, VLDL, LDLCALC  Current Medications: Current Facility-Administered Medications  Medication Dose Route Frequency Provider Last Rate Last Dose  . acetaminophen (TYLENOL) tablet 650 mg  650 mg Oral Q6H PRN Gonzella Lex, MD      . alum & mag hydroxide-simeth (MAALOX/MYLANTA) 200-200-20 MG/5ML suspension 30 mL  30 mL Oral Q4H PRN Gonzella Lex, MD      . busPIRone (BUSPAR) tablet 5 mg  5 mg Oral TID Gonzella Lex, MD   5 mg at 04/30/16 2200   . magnesium hydroxide (MILK OF MAGNESIA) suspension 30 mL  30 mL Oral Daily PRN Gonzella Lex, MD       PTA Medications: No prescriptions prior to admission    Musculoskeletal: Strength & Muscle Tone: within normal limits Gait & Station: normal Patient leans: N/A  Psychiatric Specialty Exam: Physical Exam  Nursing note and vitals reviewed. Constitutional: He appears well-developed and well-nourished.  HENT:  Head: Normocephalic and atraumatic.  Eyes: Conjunctivae are normal. Pupils are equal, round, and reactive to light.  Neck: Normal range of motion.  Cardiovascular: Normal rate, regular rhythm and normal heart sounds.   Respiratory: Effort normal and breath sounds normal. No respiratory distress.  GI: Soft.  Musculoskeletal: Normal range of motion.  Neurological: He is alert.  Skin: Skin is warm and dry.  Psychiatric: Thought content normal. His affect is blunt. His speech is delayed. He is slowed. Cognition and memory are impaired. He expresses impulsivity.    Review of Systems  Constitutional: Positive for malaise/fatigue.  HENT: Negative.   Eyes: Negative.   Respiratory: Negative.   Cardiovascular: Negative.   Gastrointestinal: Positive for nausea.  Musculoskeletal: Negative.   Skin: Negative.   Neurological: Negative.   Psychiatric/Behavioral: Positive for memory loss and substance abuse. Negative for depression, suicidal ideas and hallucinations. The patient is nervous/anxious. The patient does not have insomnia.     Blood pressure 132/92, pulse 62, temperature 98 F (36.7 C), temperature source Oral, resp. rate 20, height 5' 4"  (1.626 m), weight 79.379 kg (175 lb).Body mass index is 30.02 kg/(m^2).  General Appearance: Disheveled  Eye Sport and exercise psychologist::  Fair  Speech:  Normal Rate  Volume:  Decreased  Mood:  Anxious  Affect:  Flat  Thought Process:  Goal Directed  Orientation:  Full (Time, Place, and Person)  Thought Content:  Negative  Suicidal Thoughts:  No   Homicidal Thoughts:  No  Memory:  Immediate;   Good Recent;   Fair Remote;   Fair  Judgement:  Impaired  Insight:  Shallow  Psychomotor Activity:  Decreased  Concentration:  Fair  Recall:  [place]: Fair  Akathisia:  No  Handed:  Right  AIMS (if indicated):     Assets:  Desire for Improvement Physical Health Resilience  ADL's:  Intact  Cognition: WNL  Sleep:  Number of Hours: 8     Treatment Plan Summary: Daily contact with patient to assess and evaluate symptoms and progress in treatment, Medication management and Plan Patient was admitted to the psychiatry ward for treatment and evaluation of suicidal ideation mood problems and substance abuse. He is currently denying suicidal ideation. He is refusing offered medicine for mood and anxiety. He is requesting benzodiazepines which is not appropriate to his current condition. Supportive counseling completed. Agent discussed with nursing. No other change to medicine besides discontinuing the BuSpar. Reevaluate over the next day or so for possible discharge if he continues to show improvement.  Observation Level/Precautions:  Continuous Observation  Laboratory:  CBC UDS  Psychotherapy:  Daily individual and group psychotherapy focusing on substance abuse and coping skills   Medications:  Offered buspirone which the patient is refusing. Benzodiazepines not appropriate   Consultations:  None required at this time   Discharge Concerns:  Referral to appropriate outpatient treatment   Estimated LOS:1-2 days   Other:     I certify that inpatient services furnished can reasonably be expected to improve the patient's condition.    Alethia Berthold, MD 5/13/20173:58 PM

## 2016-05-02 MED ORDER — TRAZODONE HCL 50 MG PO TABS
50.0000 mg | ORAL_TABLET | Freq: Every evening | ORAL | Status: DC | PRN
  Administered 2016-05-02: 50 mg via ORAL
  Filled 2016-05-02: qty 1

## 2016-05-02 MED ORDER — IBUPROFEN 600 MG PO TABS
600.0000 mg | ORAL_TABLET | Freq: Four times a day (QID) | ORAL | Status: DC | PRN
  Administered 2016-05-02 – 2016-05-03 (×2): 600 mg via ORAL
  Filled 2016-05-02 (×2): qty 1

## 2016-05-02 NOTE — Progress Notes (Signed)
BPlan text stating [facility] MD Progress Note  05/02/2016 1:46 PM Richard Pierce  MRN:  [id number] Subjective:  Follow-up for 34 year old man with a history of cocaine abuse chronic anxiety symptoms in recent depression with some suicidal ideation and poor functioning who was admitted a couple days ago. Richard Pierce says he is feeling much better. He denies feeling depressed. He says his anxiety is under control. He does not have very good insight into his substance abuse problem but agrees that he is trying to stop with the drug and alcohol abuse. He is able to articulate an appropriate plan for outpatient treatment Principal Problem: Substance induced mood disorder ([place]) Diagnosis:   Patient Active Problem List   Diagnosis Date Noted  . Suicidal ideation [R45.851] 04/30/2016  . Substance induced mood disorder ([place]) [F19.94] 04/30/2016  . Cocaine abuse [F14.10] 04/30/2016  . Anxiety disorder [F41.9] 04/30/2016   Total Time spent with patient: 20 minutes  Past Psychiatric History: Past history of anxiety treatment for years with Xanax. No history of suicide attempts  Past Medical History:  Past Medical History  Diagnosis Date  . Anxiety    History reviewed. No pertinent past surgical history. Family History: History reviewed. No pertinent family history. Family Psychiatric  History: Family history of depression and anxiety Social History:  History  Alcohol Use  . 4.8 oz/week  . 8 Cans of beer per week    Comment: last drink 4 hrs ago     History  Drug Use  . Yes  . Special: IV, Cocaine    Social History   Social History  . Marital Status: Single    Spouse Name: N/A  . Number of Children: N/A  . Years of Education: N/A   Social History Main Topics  . Smoking status: Current Every Richard Pierce Smoker -- 2.00 packs/Richard Pierce for 10 years    Types: Cigarettes  . Smokeless tobacco: None  . Alcohol Use: 4.8 oz/week    8 Cans of beer per week     Comment: last drink 4 hrs ago  . Drug Use: Yes   Special: IV, Cocaine  . Sexual Activity: Not Currently    Birth Control/ Protection: None   Other Topics Concern  . None   Social History Narrative   Additional Social History:                         Sleep: Good  Appetite:  Good  Current Medications: Current Facility-Administered Medications  Medication Dose Route Frequency Provider Last Rate Last Dose  . acetaminophen (TYLENOL) tablet 650 mg  650 mg Oral Q6H PRN Gonzella Lex, MD   650 mg at 05/01/16 1634  . alum & mag hydroxide-simeth (MAALOX/MYLANTA) 200-200-20 MG/5ML suspension 30 mL  30 mL Oral Q4H PRN Gonzella Lex, MD      . busPIRone (BUSPAR) tablet 5 mg  5 mg Oral TID Gonzella Lex, MD   5 mg at 05/02/16 0853  . magnesium hydroxide (MILK OF MAGNESIA) suspension 30 mL  30 mL Oral Daily PRN Gonzella Lex, MD        Lab Results:  Results for orders placed or performed during the hospital encounter of 04/30/16 (from the past 48 hour(s))  Comprehensive metabolic panel     Status: None   Collection Time: 04/30/16  2:04 PM  Result Value Ref Range   Sodium 139 135 - 145 mmol/L   Potassium 4.3 3.5 - 5.1 mmol/L   Chloride  105 101 - 111 mmol/L   CO2 26 22 - 32 mmol/L   Glucose, Bld 90 65 - 99 mg/dL   BUN 6 6 - 20 mg/dL   Creatinine, Ser 0.74 0.61 - 1.24 mg/dL   Calcium 9.3 8.9 - 10.3 mg/dL   Total Protein 7.6 6.5 - 8.1 g/dL   Albumin 4.6 3.5 - 5.0 g/dL   AST 32 15 - 41 U/L   ALT 34 17 - 63 U/L   Alkaline Phosphatase 89 38 - 126 U/L   Total Bilirubin 0.6 0.3 - 1.2 mg/dL   GFR calc non Af Amer >60 >60 mL/min   GFR calc Af Amer >60 >60 mL/min    Comment: (NOTE) The eGFR has been calculated using the CKD EPI equation. This calculation has not been validated in all clinical situations. eGFR's persistently <60 mL/min signify possible Chronic Kidney Disease.    Anion gap 8 5 - 15  Ethanol     Status: None   Collection Time: 04/30/16  2:04 PM  Result Value Ref Range   Alcohol, Ethyl (B) <5 <5 mg/dL     Comment:        LOWEST DETECTABLE LIMIT FOR SERUM ALCOHOL IS 5 mg/dL FOR MEDICAL PURPOSES ONLY   Salicylate level     Status: None   Collection Time: 04/30/16  2:04 PM  Result Value Ref Range   Salicylate Lvl <0.2 2.8 - 30.0 mg/dL  Acetaminophen level     Status: Abnormal   Collection Time: 04/30/16  2:04 PM  Result Value Ref Range   Acetaminophen (Tylenol), Serum <10 (L) 10 - 30 ug/mL    Comment:        THERAPEUTIC CONCENTRATIONS VARY SIGNIFICANTLY. A RANGE OF 10-30 ug/mL MAY BE AN EFFECTIVE CONCENTRATION FOR MANY PATIENTS. HOWEVER, SOME ARE BEST TREATED AT CONCENTRATIONS OUTSIDE THIS RANGE. ACETAMINOPHEN CONCENTRATIONS >150 ug/mL AT 4 HOURS AFTER INGESTION AND >50 ug/mL AT 12 HOURS AFTER INGESTION ARE OFTEN ASSOCIATED WITH TOXIC REACTIONS.   cbc     Status: None   Collection Time: 04/30/16  2:04 PM  Result Value Ref Range   WBC 7.7 3.8 - 10.6 K/uL   RBC 4.85 4.40 - 5.90 MIL/uL   Hemoglobin 14.9 13.0 - 18.0 g/dL   HCT 44.1 40.0 - 52.0 %   MCV 90.9 80.0 - 100.0 fL   MCH 30.6 26.0 - 34.0 pg   MCHC 33.7 32.0 - 36.0 g/dL   RDW 13.9 11.5 - 14.5 %   Platelets 280 150 - 440 K/uL  Urine Drug Screen, Qualitative     Status: Abnormal   Collection Time: 04/30/16  2:04 PM  Result Value Ref Range   Tricyclic, Ur Screen NONE DETECTED NONE DETECTED   Amphetamines, Ur Screen NONE DETECTED NONE DETECTED   MDMA (Ecstasy)Ur Screen NONE DETECTED NONE DETECTED   Cocaine Metabolite,Ur [place] POSITIVE (A) NONE DETECTED   Opiate, Ur Screen POSITIVE (A) NONE DETECTED   Phencyclidine (PCP) Ur S NONE DETECTED NONE DETECTED   Cannabinoid 50 Ng, Ur [place] NONE DETECTED NONE DETECTED   Barbiturates, Ur Screen NONE DETECTED NONE DETECTED   Benzodiazepine, Ur Scrn POSITIVE (A) NONE DETECTED   Methadone Scn, Ur NONE DETECTED NONE DETECTED    Comment: (NOTE) 774  Tricyclics, urine               Cutoff 1000 ng/mL 200  Amphetamines, urine             Cutoff 1000 ng/mL 300  MDMA (Ecstasy),  urine            Cutoff 500 ng/mL 400  Cocaine Metabolite, urine       Cutoff 300 ng/mL 500  Opiate, urine                   Cutoff 300 ng/mL 600  Phencyclidine (PCP), urine      Cutoff 25 ng/mL 700  Cannabinoid, urine              Cutoff 50 ng/mL 800  Barbiturates, urine             Cutoff 200 ng/mL 900  Benzodiazepine, urine           Cutoff 200 ng/mL 1000 Methadone, urine                Cutoff 300 ng/mL 1100 1200 The urine drug screen provides only a preliminary, unconfirmed 1300 analytical test result and should not be used for non-medical 1400 purposes. Clinical consideration and professional judgment should 1500 be applied to any positive drug screen result due to possible 1600 interfering substances. A more specific alternate chemical method 1700 must be used in order to obtain a confirmed analytical result.  1800 Gas chromato graphy / mass spectrometry (GC/MS) is the preferred 1900 confirmatory method.     Blood Alcohol level:  Lab Results  Component Value Date   ETH <5 04/30/2016    Physical Findings: AIMS: Facial and Oral Movements Muscles of Facial Expression: None, normal Lips and Perioral Area: None, normal Jaw: None, normal Tongue: None, normal,Extremity Movements Upper (arms, wrists, hands, fingers): None, normal Lower (legs, knees, ankles, toes): None, normal, Trunk Movements Neck, shoulders, hips: None, normal, Overall Severity Severity of abnormal movements (highest score from questions above): None, normal Incapacitation due to abnormal movements: None, normal Patient's awareness of abnormal movements (rate only patient's report): No Awareness, Dental Status Current problems with teeth and/or dentures?: No Does patient usually wear dentures?: No  CIWA:  CIWA-Ar Total: 4 COWS:  COWS Total Score: 2  Musculoskeletal: Strength & Muscle Tone: within normal limits Gait & Station: normal Patient leans: N/A  Psychiatric Specialty Exam: Review of Systems  Constitutional:  Negative.   HENT: Negative.   Eyes: Negative.   Respiratory: Negative.   Cardiovascular: Negative.   Gastrointestinal: Negative.   Musculoskeletal: Negative.   Skin: Negative.   Neurological: Negative.   Psychiatric/Behavioral: Positive for substance abuse. Negative for depression, suicidal ideas, hallucinations and memory loss. The patient is not nervous/anxious and does not have insomnia.     Blood pressure 140/94, pulse 52, temperature 97.9 F (36.6 C), temperature source Oral, resp. rate 20, height 5' 4"  (1.626 m), weight 79.379 kg (175 lb).Body mass index is 30.02 kg/(m^2).  General Appearance: Casual  Eye Contact::  Fair  Speech:  Slow  Volume:  Decreased  Mood:  Euthymic  Affect:  Constricted  Thought Process:  Goal Directed  Orientation:  Full (Time, Place, and Person)  Thought Content:  Negative  Suicidal Thoughts:  No  Homicidal Thoughts:  No  Memory:  Immediate;   Good Recent;   Fair Remote;   Fair  Judgement:  Fair  Insight:  Fair  Psychomotor Activity:  Decreased  Concentration:  Fair  Recall:  [employer] of Knowledge:Fair  Language: Fair  Akathisia:  No  Handed:  Right  AIMS (if indicated):     Assets:  Desire for Improvement Housing Physical Health  ADL's:  Intact  Cognition: WNL  Sleep:  Number of Hours: 7.25   Treatment Plan SummarPatient likely can be discharged within the next Richard Pierce or so. He refused BuSpar and he has not to be getting benzodiazepines. He is not showing any active symptoms of withdrawal. No sign of delirium. Vitals stable. No suicidality. Patient can be referred for appropriate outpatient treatment in the community.Plan Patient likely can be discharged within the next Richard Pierce or so. He refused BuSpar and he has not to be getting benzodiazepines. He is not showing any active symptoms of withdrawal. No sign of delirium. Vitals stable. No suicidality. Patient can be referred for appropriate outpatient treatment in the community.  Alethia Berthold,  MD 05/02/2016, 1:46 PM

## 2016-05-02 NOTE — Progress Notes (Signed)
Pt has been pleasant and cooperative. Pt's mood and affect has been depressed. Pt has been compliant with taking his po meds today with little encouragement. Pt denies SI and A/V hallucinations. Pt appears to be slightly paranoid at times. Pt has been more  active on the unit.

## 2016-05-02 NOTE — [facility] Group Notes (Signed)
[facility] Group Notes:  (Nursing/MHT/Case Management/Adjunct)  Date:  05/02/2016  Time:  12:06 AM  Type of Therapy:  Group Therapy  Participation Level:  Active  Participation Quality:  Attentive  Affect:  Flat  Cognitive:  Alert  Insight:  Limited  Engagement in Group:  Defensive  Modes of Intervention:  n/a  Summary of Progress/Problems:   Richard Pierce [name] 05/02/2016, 12:06 AM

## 2016-05-02 NOTE — Progress Notes (Signed)
D: Observed pt in room sleeping. Patient alert and oriented x4. Patient denies SI/HI/AVH. Pt affect is irritable and blunted. Pt initially got out of bed for evening medication, then came to room stating "I refuse." Pt obviously irritable, and very short with Clinical research associatewriter. When asked about mood pt stated "I'm on top of the world." Pt denied feeling depressed or anxious. Pt did not want to talk further.  A: Offered active listening and support. Provided therapeutic communication. Attempted to educate pt on medication regimen.  R: Pt continued to refuse medication, but cooperative otherwise. Will continue Q15 min. checks. Safety maintained.

## 2016-05-02 NOTE — B[facility] Group Notes (Signed)
[facility] Group Notes:  (Nursing/MHT/Case Management/Adjunct)  Date:  05/02/2016  Time:  10:19 AM  Type of Therapy:  goal setting   Participation Level:  Did Not Attend  Meaghann Choo C Wasil Wolke 05/02/2016, 10:19 AM 

## 2016-05-02 NOTE — Plan of Care (Signed)
Problem: Diagnosis: Increased Risk For Suicide Attempt Goal: STG-Patient Will Comply With Medication Regime Outcome: Progressing Pt refused evening Buspar

## 2016-05-03 DIAGNOSIS — F1424 Cocaine dependence with cocaine-induced mood disorder: Secondary | ICD-10-CM

## 2016-05-03 DIAGNOSIS — F1420 Cocaine dependence, uncomplicated: Secondary | ICD-10-CM

## 2016-05-03 DIAGNOSIS — F17200 Nicotine dependence, unspecified, uncomplicated: Secondary | ICD-10-CM

## 2016-05-03 DIAGNOSIS — F3289 Other specified depressive episodes: Secondary | ICD-10-CM

## 2016-05-03 DIAGNOSIS — F1320 Sedative, hypnotic or anxiolytic dependence, uncomplicated: Secondary | ICD-10-CM

## 2016-05-03 DIAGNOSIS — F1120 Opioid dependence, uncomplicated: Secondary | ICD-10-CM

## 2016-05-03 MED ORDER — BUSPIRONE HCL 5 MG PO TABS: 5.0000 mg | ORAL_TABLET | Freq: Three times a day (TID) | ORAL | Status: AC

## 2016-05-03 MED ORDER — TRAZODONE HCL 50 MG PO TABS: 50.0000 mg | ORAL_TABLET | Freq: Every evening | ORAL | Status: AC | PRN

## 2016-05-03 NOTE — Plan of Care (Signed)
Problem: Ineffective individual coping Goal: STG: Patient will remain free from self harm Outcome: Progressing Pt remains free from harm.  Problem: Diagnosis: Increased Risk For Suicide Attempt Goal: STG-Patient Will Comply With Medication Regime Outcome: Progressing Pt taking medications as prescribed.     

## 2016-05-03 NOTE — [facility] Suicide Risk Assessment (Signed)
[facility]   Principal Problem: Substance induced mood disorder [facility](HCC) Discharge Diagnoses:  Patient Active Problem List   Diagnosis Date Noted  . Suicidal ideation [R45.851] 04/30/2016  . Substance induced mood disorder (HCC) [F19.94] 04/30/2016  . Cocaine abuse [F14.10] 04/30/2016  . Anxiety disorder [F41.9] 04/30/2016     Psychiatric Specialty Exam: ROS  Blood pressure 135/81, pulse 61, temperature 97.8 F (36.6 C), temperature source Oral, resp. rate 20, height 5\' 4"  (1.626 m), weight 79.379 kg (175 lb).Body mass index is 30.02 kg/(m^2).                                                       Mental Status Per Nursing Assessment::   On Admission:  NA  Demographic Factors:  Male, Caucasian, Low socioeconomic status and Unemployed  Loss Factors: Legal issues  Historical Factors: Impulsivity  Risk Reduction Factors:   Responsible for children under 34 years of age and Sense of responsibility to family  Continued Clinical Symptoms:  Alcohol/Substance Abuse/Dependencies  Cognitive Features That Contribute To Risk:  None    Suicide Risk:  Minimal: No identifiable suicidal ideation.  Patients presenting with no risk factors but with morbid ruminations; may be classified as minimal risk based on the severity of the depressive symptoms   Richard Pierce,  Richard Cahall, MD 05/03/2016, 12:33 PM

## 2016-05-03 NOTE — Progress Notes (Signed)
Recreation Therapy Notes  Date: 05.15.17 Time: 1:00 pm Location: Craft Room  Group Topic: Wellness  Goal Area(s) Addresses:  Patient will identify at least one item per dimension of health. Patient will examine areas they are deficient in.  Behavioral Response: Attentive, Interactive  Intervention: 6 Dimensions of Health  Activity: Patients were given a definition sheet of the 6 dimensions of health. Patients were given a worksheet with the dimensions of health and instructed to write 2-3 things per each category.  Education: LRT educated patients on how they can improve each dimension of health.  Education Outcome: In group clarification offered   Clinical Observations/Feedback: Patient completed activity by writing at least 1 item in each category. Patient contributed to group discussion by stating things he can do to improve certain areas.  Jacquelynn CreeGreene,Saori Umholtz M, LRT/CTRS 05/03/2016 3:45 PM

## 2016-05-03 NOTE — BHH Suicide Risk Assessment (Signed)
B[facility] INPATIENT:  Family/Significant Other Suicide Prevention Education  Suicide Prevention Education:  Education Completed; Sheldon SilvanKatie Hinshaw, Engineer, miningAunt,  (name of family member/significant other) has been identified by the patient as the family member/significant other with whom the patient will be residing, and identified as the person(s) who will aid the patient in the event of a mental health crisis (suicidal ideations/suicide attempt).  With written consent from the patient, the family member/significant other has been provided the following suicide prevention education, prior to the and/or following the discharge of the patient.  The suicide prevention education provided includes the following:  Suicide risk factors  Suicide prevention and interventions  National Suicide Hotline telephone number  [facility] assessment telephone number  [facility] Emergency Assistance 911  [facility] and/or Residential Mobile Crisis Unit telephone number  Request made of family/significant other to:  Remove weapons (e.g., guns, rifles, knives), all items previously/currently identified as safety concern.    Remove drugs/medications (over-the-counter, prescriptions, illicit drugs), all items previously/currently identified as a safety concern.  The family member/significant other verbalizes understanding of the suicide prevention education information provided.  The family member/significant other agrees to remove the items of safety concern listed above.  Glennon MacLaws, Castulo Scarpelli P, MSW, LCSW 05/03/2016, 5:23 PM

## 2016-05-03 NOTE — Progress Notes (Signed)
  [facility] :  Will you be returning to the same living situation after discharge:  No.with a friend At discharge, do you have transportation home?: Yes,    Do you have the ability to pay for your medications: Yes,     Release of information consent forms completed and in the chart;  Patient's signature needed at discharge.  Patient to Follow up at: Follow-up Information    Schedule an appointment as soon as possible for a [facility] with [facility].   Why:  Call and schedule interview with Jackquline Boschaurus Wright at [phone].   Contact information:   [address] [place] [address] [phone] [phone]      Go to [facility].   Why:  Walk-ins Monday, Wednesday, Friday between 8am-3pm, Hospital Follow up, Outpatient Medication Management, Therapy   Contact information:   [address] [place] [address] [phone] FAX      Next level of care provider has access to [facility] Link:no  Safety Planning and Suicide Prevention discussed: Yes,     Have you used any form of tobacco in the last 30 days? (Cigarettes, Smokeless Tobacco, Cigars, and/or Pipes): Yes  Has patient been referred to the Quitline?: Patient refused referral  Patient has been referred for addiction treatment: Yes  Kinzly Pierrelouis, Cleda DaubSara P, MSW, LCSW 05/03/2016, 5:26 PM

## 2016-05-03 NOTE — Progress Notes (Signed)
Recreation Therapy Notes  INPATIENT RECREATION THERAPY ASSESSMENT  Patient Details Name: Joan Mayansimothy M Glaude MRN: [id number] DOB: 22-Jan-1981 Today's Date: 05/03/2016  Patient Stressors:  Patient reported no stressors.  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

## 2016-05-03 NOTE — Progress Notes (Signed)
D: Pt affect is sad this evening. He is seen in the milieu interacting appropriately with staff and peers. Denies SI/HI/AVH at this time. Pt c/o tooth pain unrelieved by PRN Tylenol. PRN Ibuprofen ordered and administered.  A: Emotional support and encouragement provided. Medications administered with education. q15 minute safety checks maintained. R: Pt remains free from harm. Will continue to monitor.

## 2016-05-03 NOTE — BHH Group Notes (Signed)
B[facility] Group Notes:  (Nursing/MHT/Case Management/Adjunct)  Date:  05/03/2016  Time:  4:04 PM  Type of Therapy:  Psychoeducational Skills  Participation Level:  Active  Participation Quality:  Appropriate, Attentive and Supportive  Affect:  Appropriate  Cognitive:  Appropriate  Insight:  Appropriate  Engagement in Group:  Supportive  Modes of Intervention:  Discussion and Education  Summary of Progress/Problems:  Richard Pierce 05/03/2016, 4:04 PM

## 2016-05-03 NOTE — Progress Notes (Signed)
Patient denies SI/HI, denies A/V hallucinations. Patient verbalizes understanding of discharge instructions, follow up care and prescriptions. Patient given all belongings from  [facility]. 7 days medicine given. Patient escorted out by staff, transported by family.

## 2016-05-03 NOTE — Discharge Summary (Signed)
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 Tobacco use disorder [F17.200] 05/03/2016  . Cocaine use disorder, severe, dependence ([place]) [F14.20] 05/03/2016  . 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 Opioid use disorder, moderate, dependence ([place]) [F11.20] 05/03/2016  . 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 Suicidal ideation [R45.851] 04/30/2016  . Anxiety disorder [F41.9] 04/30/2016   History of Present Illness:: 35 year old man admitted through the emergency room where he presented yesterday. Patient was taking suicidal statements feeling like he was going to do something to kill himself. Feeling like his heavy drug abuse had been partially suicidal. Mood is been depressed and anxious for many days. Hadn't been sleeping in days hadn't been eating in days. Had been on a heavy binge of cocaine and probably other drugs. Mood feeling hopeless and helpless and overwhelmed. Not currently on any psychiatric medicine or in any psychiatric treatment. Major stress just recently having been released from jail having no really good stable place to stay no job no family support. Associated Signs/Symptoms: Depression Symptoms: depressed mood, anhedonia, hypersomnia, psychomotor retardation, fatigue, feelings of worthlessness/guilt, difficulty concentrating, hopelessness, suicidal thoughts with specific plan, loss of  energy/fatigue, disturbed sleep, (Hypo) Manic Symptoms: Distractibility, Anxiety Symptoms: Excessive Worry, Social Anxiety, Psychotic Symptoms: Paranoia, PTSD Symptoms: Negative   Past Psychiatric History: Patient states he had been treated for anxiety in the past by an outpatient provider and used to take Xanax 2 mg 3 times a day. This was discontinued over a year ago when he was in jail. Was treated with BuSpar while in jail. He told me last night that the BuSpar had been helpful to him but today he is refusing to take it saying that it makes him sick to his stomach. Denies any prior suicide attempts. Denies being aware of any other medications he has taken in the past.  Past Medical History:  Past Medical History  Diagnosis Date  . Anxiety    History reviewed. No pertinent past surgical history.  Family History: History reviewed. No pertinent family history.  Family Psychiatric  History: denies  Social History:  History  Alcohol Use  . 4.8 oz/week  . 8 Cans of beer per week    Comment: last drink 4 hrs ago     History  Drug Use  . Yes  . Special: IV, Cocaine    Social History   Social History  . Marital Status: Single    Spouse Name: N/A  . Number of Children: N/A  . Years of Education: N/A   Social History Main Topics  . Smoking status: Current Every Day Smoker -- 2.00 packs/day for 10 years    Types: Cigarettes  . Smokeless tobacco: None  . Alcohol Use: 4.8 oz/week    8 Cans of beer per week     Comment: last drink 4 hrs ago  . Drug Use: Yes    Special: IV, Cocaine  . Sexual Activity: Not Currently    Birth Control/ Protection:  None   Other Topics Concern  . None   Social History Narrative    Hospital Course:    Patient presented to our emergency department positive for opiates, benzodiazepines and cocaine.  Patient stated that he was just released from prison about 2 weeks ago. He was incarcerated for 18 months after stealing a car. The patient  stated that last week he was using drugs heavily. He says that when he arrived to the emergency department he was out of his mind.  He no longer is feels suicidal or depressed and is requesting discharge.  Patient is voicing interest on going to a faith based substance abuse program or any other type of rehabilitation.  He however does not want to wait for a bed to open up and he is willing to follow up on a referral outpatient.  Patient says that he would like to be discharged today and if not accepted to a faith-based programs or [facility] he can go and stay with a friend here in [place].  Patient met with Karie Soda from [place]. Support program. The patient has Mr. Lupita Dawn contact information. He plans to follow-up with [facility] outpatient.  He will be given prescriptions for trazodone and BuSpar.  This hospitalization was otherwise uneventful. The patient was pleasant, calm and cooperative. He did not display any unsafe or destructive behavior. He participated in programming. He was compliant with medications. He did not require seclusion, restraints or forced medications.  On discharge patient denies suicidality, homicidality or having auditory or visual hallucinations. He denies depressed mood or major problems with sleep, appetite, energy or concentration. He denies having any psychotic symptoms, physical complaints or side effects from medications.  Physical Findings: AIMS: Facial and Oral Movements Muscles of Facial Expression: None, normal Lips and Perioral Area: None, normal Jaw: None, normal Tongue: None, normal,Extremity Movements Upper (arms, wrists, hands, fingers): None, normal Lower (legs, knees, ankles, toes): None, normal, Trunk Movements Neck, shoulders, hips: None, normal, Overall Severity Severity of abnormal movements (highest score from questions above): None, normal Incapacitation due to abnormal movements: None, normal Patient's awareness of abnormal movements (rate only  patient's report): No Awareness, Dental Status Current problems with teeth and/or dentures?: No Does patient usually wear dentures?: No  CIWA:  CIWA-Ar Total: 4 COWS:  COWS Total Score: 2  Musculoskeletal: Strength & Muscle Tone: within normal limits Gait & Station: normal Patient leans: N/A  Psychiatric Specialty Exam: Review of Systems  Constitutional: Negative.   HENT: Negative.   Eyes: Negative.   Respiratory: Negative.   Cardiovascular: Negative.   Gastrointestinal: Negative.   Genitourinary: Negative.   Musculoskeletal: Negative.   Skin: Negative.   Neurological: Negative.   Endo/Heme/Allergies: Negative.   Psychiatric/Behavioral: Negative.     Blood pressure 135/81, pulse 61, temperature 97.8 F (36.6 C), temperature source Oral, resp. rate 20, height 5' 4"  (1.626 m), weight 79.379 kg (175 lb).Body mass index is 30.02 kg/(m^2).  General Appearance: Well Groomed  Engineer, water::  Good  Speech:  Clear and Coherent  Volume:  Normal  Mood:  Anxious  Affect:  Appropriate  Thought Process:  Linear  Orientation:  Full (Time, Place, and Person)  Thought Content:  Hallucinations: None  Suicidal Thoughts:  No  Homicidal Thoughts:  No  Memory:  Immediate;   Good Recent;   Good Remote;   Good  Judgement:  Fair  Insight:  Fair  Psychomotor Activity:  Normal  Concentration:  Fair  Recall:  Good  Fund of [place]  Language:  Good  Akathisia:  No  Handed:    AIMS (if indicated):     Assets:  Communication Skills Social Support  ADL's:  Intact  Cognition: WNL  Sleep:  Number of Hours: 7.25   Have you used any form of tobacco in the last 30 days? (Cigarettes, Smokeless Tobacco, Cigars, and/or Pipes): Yes  Has this patient used any form of tobacco in the last 30 days? (Cigarettes, Smokeless Tobacco, Cigars, and/or Pipes) Yes, Yes, A prescription for an FDA-approved tobacco cessation medication was offered at discharge and the patient refused  Blood Alcohol level:   Lab Results  Component Value Date   [facility] <5 04/30/2016   Results for LAITHAN, CONCHAS (MRN [id number]) as of 05/03/2016 13:30  Ref. Range 03/30/2015 21:46 04/30/2016 14:04  Sodium Latest Ref Range: 135-145 mmol/L  139  Potassium Latest Ref Range: 3.5-5.1 mmol/L  4.3  Chloride Latest Ref Range: 101-111 mmol/L  105  CO2 Latest Ref Range: 22-32 mmol/L  26  BUN Latest Ref Range: 6-20 mg/dL  6  Creatinine Latest Ref Range: 0.61-1.24 mg/dL  0.74  Calcium Latest Ref Range: 8.9-10.3 mg/dL  9.3  EGFR (Non-African Amer.) Latest Ref Range: >60 mL/min  >60  EGFR (African American) Latest Ref Range: >60 mL/min  >60  Glucose Latest Ref Range: 65-99 mg/dL  90  Anion gap Latest Ref Range: 5-15   8  Alkaline Phosphatase Latest Ref Range: 38-126 U/L  89  Albumin Latest Ref Range: 3.5-5.0 g/dL  4.6  AST Latest Ref Range: 15-41 U/L  32  ALT Latest Ref Range: 17-63 U/L  34  Total Protein Latest Ref Range: 6.5-8.1 g/dL  7.6  Total Bilirubin Latest Ref Range: 0.3-1.2 mg/dL  0.6  WBC Latest Ref Range: 3.8-10.6 K/uL  7.7  RBC Latest Ref Range: 4.40-5.90 MIL/uL  4.85  Hemoglobin Latest Ref Range: 13.0-18.0 g/dL  14.9  HCT Latest Ref Range: 40.0-52.0 %  44.1  MCV Latest Ref Range: 80.0-100.0 fL  90.9  MCH Latest Ref Range: 26.0-34.0 pg  30.6  MCHC Latest Ref Range: 32.0-36.0 g/dL  33.7  RDW Latest Ref Range: 11.5-14.5 %  13.9  Platelets Latest Ref Range: 150-440 K/uL  280  Acetaminophen (Tylenol), S Latest Ref Range: 10-30 ug/mL  <29 (L)  Salicylate Lvl Latest Ref Range: 2.8-30.0 mg/dL  <4.0  Alcohol, Ethyl (B) Latest Ref Range: <5 mg/dL  <5  Amphetamines, Ur Screen Latest Ref Range: NONE DETECTED   NONE DETECTED  Barbiturates, Ur Screen Latest Ref Range: NONE DETECTED   NONE DETECTED  Benzodiazepine, Ur Scrn Latest Ref Range: NONE DETECTED   POSITIVE (A)  Cocaine Metabolite,Ur [place] Latest Ref Range: NONE DETECTED   POSITIVE (A)  Methadone Scn, Ur Latest Ref Range: NONE DETECTED   NONE DETECTED  MDMA  (Ecstasy)Ur Screen Latest Ref Range: NONE DETECTED   NONE DETECTED  Cannabinoid 50 Ng, Ur [place] Latest Ref Range: NONE DETECTED   NONE DETECTED  Opiate, Ur Screen Latest Ref Range: NONE DETECTED   POSITIVE (A)  Phencyclidine (PCP) Ur S Latest Ref Range: NONE DETECTED   NONE DETECTED  Tricyclic, Ur Screen Latest Ref Range: NONE DETECTED   NONE DETECTED  DG ELBOW COMPLETE RIGHT Unknown Rpt    Metabolic Disorder Labs:  No results found for: HGBA1C, MPG No results found for: PROLACTIN No results found for: CHOL, TRIG, HDL, CHOLHDL, VLDL, LDLCALC  See Psychiatric Specialty Exam and Suicide Risk Assessment completed by Attending Physician prior to discharge.  Discharge destination:  Home  Is  patient on multiple antipsychotic therapies at discharge:  No   Has Patient had three or more failed trials of antipsychotic monotherapy by history:  No  Recommended Plan for Multiple Antipsychotic Therapies: NA     Medication List    TAKE these medications      Indication   busPIRone 5 MG tablet  Commonly known as:  BUSPAR  Take 1 tablet (5 mg total) by mouth 3 (three) times daily.  Notes to Patient:  Nervousness and anxiety      traZODone 50 MG tablet  Commonly known as:  DESYREL  Take 1 tablet (50 mg total) by mouth at bedtime as needed for sleep.  Notes to Patient:  Insomnia          Signed: Hildred Priest, MD 05/03/2016, 1:32 PM

## 2016-05-07 ENCOUNTER — Emergency Department: Payer: Self-pay

## 2016-05-07 ENCOUNTER — Emergency Department
Admission: EM | Admit: 2016-05-07 | Discharge: 2016-05-07 | Disposition: A | Discharge status: Home / Self Care | Payer: Self-pay | Attending: Emergency Medicine | Admitting: Emergency Medicine

## 2016-05-07 DIAGNOSIS — Y9389 Activity, other specified: Secondary | ICD-10-CM | POA: Insufficient documentation

## 2016-05-07 DIAGNOSIS — F17210 Nicotine dependence, cigarettes, uncomplicated: Secondary | ICD-10-CM | POA: Insufficient documentation

## 2016-05-07 DIAGNOSIS — H1131 Conjunctival hemorrhage, right eye: Secondary | ICD-10-CM | POA: Insufficient documentation

## 2016-05-07 DIAGNOSIS — S022XXA Fracture of nasal bones, initial encounter for closed fracture: Secondary | ICD-10-CM | POA: Insufficient documentation

## 2016-05-07 DIAGNOSIS — Y929 Unspecified place or not applicable: Secondary | ICD-10-CM | POA: Insufficient documentation

## 2016-05-07 DIAGNOSIS — Y999 Unspecified external cause status: Secondary | ICD-10-CM | POA: Insufficient documentation

## 2016-05-07 MED ORDER — HYDROCODONE-ACETAMINOPHEN 5-325 MG PO TABS
ORAL_TABLET | ORAL | Status: AC
  Filled 2016-05-07: qty 1

## 2016-05-07 MED ORDER — OXYCODONE-ACETAMINOPHEN 5-325 MG PO TABS
2.0000 | ORAL_TABLET | Freq: Once | ORAL | Status: AC
  Administered 2016-05-07: 2 via ORAL
  Filled 2016-05-07: qty 2

## 2016-05-07 MED ORDER — HYDROCODONE-ACETAMINOPHEN 5-325 MG PO TABS
1.0000 | ORAL_TABLET | Freq: Once | ORAL | Status: AC
  Administered 2016-05-07: 1 via ORAL

## 2016-05-07 MED ORDER — HYDROCODONE-ACETAMINOPHEN 5-325 MG PO TABS: 1.0000 | ORAL_TABLET | ORAL | Status: AC | PRN

## 2016-05-07 MED ORDER — CEPHALEXIN 500 MG PO CAPS: 500.0000 mg | ORAL_CAPSULE | Freq: Three times a day (TID) | ORAL | Status: AC

## 2016-05-07 NOTE — ED Notes (Signed)
20/30 right eye, 20/15 left eye
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
## 2016-05-07 NOTE — Discharge Instructions (Signed)
Please call the number provided for ENT for follow-up appointment in approximately one week. Please take your pain medication as needed, as prescribed. Please take your antibiotics for their entire course. Please do not blow your nose for the next 2 weeks. Return to the emergency department for any worsening headache, fever, or visual changes.   Nasal Fracture A nasal fracture is a break or crack in the bones or cartilage of the nose. Minor breaks do not require treatment. These breaks usually heal on their own after about one month. Serious breaks may require surgery. CAUSES This injury is usually caused by a blunt injury to the nose. This type of injury often occurs from:  Contact sports.  Car accidents.  Falls.  Getting punched. SYMPTOMS Symptoms of this injury include:  Pain.  Swelling of the nose.  Bleeding from the nose.  Bruising around the nose or eyes. This may include having black eyes.  Crooked appearance of the nose. DIAGNOSIS This injury may be diagnosed with a physical exam. The health care provider will gently feel the nose for signs of broken bones. He or she will look inside the nostrils to make sure that there is not a blood-filled swelling on the dividing wall between the nostrils (septal hematoma). X-rays of the nose may not show a nasal fracture even when one is present. In some cases, X-rays or a CT scan may be done 1-5 days after the injury. Sometimes, the health care provider will want to wait until the swelling has gone down. TREATMENT Often, minor fractures that have caused no deformity do not require treatment. More serious fractures in which bones have moved out of position may require surgery, which will take place after the swelling is gone. Surgery will stabilize and align the fracture. In some cases, a health care provider may be able to reposition the bones without surgery. This may be done in the health care provider's office after medicine is given to  numb the area (local anesthetic). HOME CARE INSTRUCTIONS  If directed, apply ice to the injured area:  Put ice in a plastic bag.  Place a towel between your skin and the bag.  Leave the ice on for 20 minutes, 2-3 times per day.  Take over-the-counter and prescription medicines only as told by your health care provider.  If your nose starts to bleed, sit in an upright position while you squeeze the soft parts of your nose against the dividing wall between your nostrils (septum) for 10 minutes.  Try to avoid blowing your nose.  Return to your normal activities as told by your health care provider. Ask your health care provider what activities are safe for you.  Avoid contact sports for 3-4 weeks or as told by your health care provider.  Keep all follow-up visits as told by your health care provider. This is important. SEEK MEDICAL CARE IF:  Your pain increases or becomes severe.  You continue to have nosebleeds.  The shape of your nose does not return to normal within 5 days.  You have pus draining out of your nose. SEEK IMMEDIATE MEDICAL CARE IF:  You have bleeding from your nose that does not stop after you pinch your nostrils closed for 20 minutes and keep ice on your nose.  You have clear fluid draining out of your nose.  You notice a grape-like swelling on the septum. This swelling is a collection of blood (hematoma) that must be drained to help prevent infection.  You have difficulty  moving your eyes.  You have repeated vomiting.   This information is not intended to replace advice given to you by your health care provider. Make sure you discuss any questions you have with your health care provider.   Document Released: 12/03/2000 Document Revised: 08/27/2015 Document Reviewed: 01/13/2015 Elsevier Interactive Patient Education [employer]

## 2016-05-09 MED ORDER — ONDANSETRON HCL 4 MG/2ML IJ SOLN
INTRAMUSCULAR | Status: AC
  Filled 2016-05-09: qty 2

## 2016-05-09 MED ORDER — MORPHINE SULFATE (PF) 2 MG/ML IV SOLN
INTRAVENOUS | Status: AC
  Filled 2016-05-09: qty 1
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
## 2022-12-08 ENCOUNTER — Other Ambulatory Visit: Payer: Self-pay

## 2022-12-08 ENCOUNTER — Emergency Department (HOSPITAL_COMMUNITY): Payer: Medicaid Other

## 2022-12-08 ENCOUNTER — Emergency Department (HOSPITAL_COMMUNITY)
Admission: EM | Admit: 2022-12-08 | Discharge: 2022-12-09 | Discharge status: Against Medical Advice | Payer: Medicaid Other | Attending: Emergency Medicine | Admitting: Emergency Medicine

## 2022-12-08 ENCOUNTER — Encounter (HOSPITAL_COMMUNITY): Payer: Self-pay

## 2022-12-08 DIAGNOSIS — Z5321 Procedure and treatment not carried out due to patient leaving prior to being seen by health care provider: Secondary | ICD-10-CM | POA: Diagnosis not present

## 2022-12-08 DIAGNOSIS — R209 Unspecified disturbances of skin sensation: Secondary | ICD-10-CM | POA: Insufficient documentation

## 2022-12-08 LAB — COMPREHENSIVE METABOLIC PANEL
ALT: 124 U/L — ABNORMAL HIGH (ref 0–44)
AST: 70 U/L — ABNORMAL HIGH (ref 15–41)
Albumin: 3.6 g/dL (ref 3.5–5.0)
Alkaline Phosphatase: 74 U/L (ref 38–126)
Anion gap: 9 (ref 5–15)
BUN: 13 mg/dL (ref 6–20)
CO2: 22 mmol/L (ref 22–32)
Calcium: 8.8 mg/dL — ABNORMAL LOW (ref 8.9–10.3)
Chloride: 106 mmol/L (ref 98–111)
Creatinine, Ser: 0.69 mg/dL (ref 0.61–1.24)
GFR, Estimated: >60 mL/min (ref >60)
Glucose, Bld: 97 mg/dL (ref 70–99)
Potassium: 4.2 mmol/L (ref 3.5–5.1)
Sodium: 137 mmol/L (ref 135–145)
Total Bilirubin: 0.3 mg/dL (ref 0.3–1.2)
Total Protein: 6.4 g/dL — ABNORMAL LOW (ref 6.5–8.1)

## 2022-12-08 LAB — CBC WITH DIFFERENTIAL/PLATELET
Abs Immature Granulocytes: 0.01 10*3/uL (ref 0.00–0.07)
Basophils Absolute: 0.1 10*3/uL (ref 0.0–0.1)
Basophils Relative: 3 %
Eosinophils Absolute: 0.3 10*3/uL (ref 0.0–0.5)
Eosinophils Relative: 6 %
HCT: 39.3 % (ref 39.0–52.0)
Hemoglobin: 13.7 g/dL (ref 13.0–17.0)
Immature Granulocytes: 0 %
Lymphocytes Relative: 40 %
Lymphs Abs: 2 10*3/uL (ref 0.7–4.0)
MCH: 31.1 pg (ref 26.0–34.0)
MCHC: 34.9 g/dL (ref 30.0–36.0)
MCV: 89.1 fL (ref 80.0–100.0)
Monocytes Absolute: 0.5 10*3/uL (ref 0.1–1.0)
Monocytes Relative: 10 %
Neutro Abs: 2 10*3/uL (ref 1.7–7.7)
Neutrophils Relative %: 41 %
Platelets: 274 10*3/uL (ref 150–400)
RBC: 4.41 MIL/uL (ref 4.22–5.81)
RDW: 13.2 % (ref 11.5–15.5)
WBC: 5 10*3/uL (ref 4.0–10.5)
nRBC: 0 % (ref 0.0–0.2)

## 2022-12-08 NOTE — ED Triage Notes (Signed)
Pt reports bilateral hand numbness for the past 2 weeks, worsening last night, states he woke up with pins and needles type pain in both hands. Pt denies headache or dizziness. Pt a.o, ambulatory

## 2022-12-08 NOTE — ED Provider Triage Note (Signed)
Emergency Medicine Provider Triage Evaluation Note  Richard Pierce , a 41 y.o. male  was evaluated in triage.  Pt complains of upper extremity/hand numbness and pins and needle sensation.  Started a few weeks ago, that the numbness is constant but he feels like his grip strength has been weakening.  He the pins and needle sensation comes and goes, sometimes it wakes him up from his sleep.  No headache, vision changes, lower extremity weakness or numbness.  Denies any recent viral symptoms.  He is on Suboxone, no other medications.  Review of Systems  Per HPI  Physical Exam  BP 136/88   Pulse 83   Temp 97.9 F (36.6 C) (Oral)   Resp 16   Ht 5\' 5"  (1.651 m)   Wt 81.6 kg   SpO2 100%   BMI 29.95 kg/m  Gen:   Awake, no distress   Resp:  Normal effort  MSK:   Moves extremities without difficulty  Other:  Cranial nerves II through XII are grossly intact.  Lower extremity strength symmetric bilaterally.  Intact lower extremity reflexes 2+.  Upper extremity strength is symmetric but reduced with grip.  Normal elbows, shoulders.  Medical Decision Making  Medically screening exam initiated at 1:56 PM.  Appropriate orders placed.  HELTON OLESON was informed that the remainder of the evaluation will be completed by another provider, this initial triage assessment does not replace that evaluation, and the importance of remaining in the ED until their evaluation is complete.     Joan Mayans, PA-C 12/08/22 1400

## 2022-12-09 NOTE — ED Notes (Signed)
Pt name called for updated vitals, no response 

## 2023-08-20 ENCOUNTER — Emergency Department: Payer: Medicaid Other

## 2023-08-20 ENCOUNTER — Other Ambulatory Visit: Payer: Self-pay

## 2023-08-20 ENCOUNTER — Emergency Department
Admission: EM | Admit: 2023-08-20 | Discharge: 2023-08-20 | Disposition: A | Discharge status: Home / Self Care | Payer: Medicaid Other | Attending: Student in an Organized Health Care Education/Training Program | Admitting: Student in an Organized Health Care Education/Training Program

## 2023-08-20 DIAGNOSIS — T424X1A Poisoning by benzodiazepines, accidental (unintentional), initial encounter: Secondary | ICD-10-CM | POA: Diagnosis present

## 2023-08-20 DIAGNOSIS — T50901A Poisoning by unspecified drugs, medicaments and biological substances, accidental (unintentional), initial encounter: Secondary | ICD-10-CM

## 2023-08-20 DIAGNOSIS — T43621A Poisoning by amphetamines, accidental (unintentional), initial encounter: Secondary | ICD-10-CM | POA: Diagnosis not present

## 2023-08-20 DIAGNOSIS — R001 Bradycardia, unspecified: Secondary | ICD-10-CM | POA: Insufficient documentation

## 2023-08-20 DIAGNOSIS — T405X1A Poisoning by cocaine, accidental (unintentional), initial encounter: Secondary | ICD-10-CM | POA: Insufficient documentation

## 2023-08-20 DIAGNOSIS — R7309 Other abnormal glucose: Secondary | ICD-10-CM | POA: Diagnosis not present

## 2023-08-20 LAB — BASIC METABOLIC PANEL
Anion gap: 9 (ref 5–15)
BUN: 15 mg/dL (ref 6–20)
CO2: 28 mmol/L (ref 22–32)
Calcium: 9.2 mg/dL (ref 8.9–10.3)
Chloride: 99 mmol/L (ref 98–111)
Creatinine, Ser: 0.81 mg/dL (ref 0.61–1.24)
GFR, Estimated: >60 mL/min (ref >60)
Glucose, Bld: 98 mg/dL (ref 70–99)
Potassium: 3.4 mmol/L — ABNORMAL LOW (ref 3.5–5.1)
Sodium: 136 mmol/L (ref 135–145)

## 2023-08-20 LAB — URINE DRUG SCREEN, QUALITATIVE (ARMC ONLY)
Amphetamines, Ur Screen: POSITIVE — AB
Barbiturates, Ur Screen: NOT DETECTED
Benzodiazepine, Ur Scrn: POSITIVE — AB
Cannabinoid 50 Ng, Ur ~~LOC~~: POSITIVE — AB
Cocaine Metabolite,Ur ~~LOC~~: POSITIVE — AB
MDMA (Ecstasy)Ur Screen: NOT DETECTED
Methadone Scn, Ur: NOT DETECTED
Opiate, Ur Screen: NOT DETECTED
Phencyclidine (PCP) Ur S: NOT DETECTED
Tricyclic, Ur Screen: NOT DETECTED

## 2023-08-20 LAB — CBC WITH DIFFERENTIAL/PLATELET
Abs Immature Granulocytes: 0.03 10*3/uL (ref 0.00–0.07)
Basophils Absolute: 0.1 10*3/uL (ref 0.0–0.1)
Basophils Relative: 1 %
Eosinophils Absolute: 0.1 10*3/uL (ref 0.0–0.5)
Eosinophils Relative: 1 %
HCT: 46.2 % (ref 39.0–52.0)
Hemoglobin: 15.8 g/dL (ref 13.0–17.0)
Immature Granulocytes: 0 %
Lymphocytes Relative: 17 %
Lymphs Abs: 1.7 10*3/uL (ref 0.7–4.0)
MCH: 30.9 pg (ref 26.0–34.0)
MCHC: 34.2 g/dL (ref 30.0–36.0)
MCV: 90.4 fL (ref 80.0–100.0)
Monocytes Absolute: 0.5 10*3/uL (ref 0.1–1.0)
Monocytes Relative: 5 %
Neutro Abs: 7.3 10*3/uL (ref 1.7–7.7)
Neutrophils Relative %: 76 %
Platelets: 325 10*3/uL (ref 150–400)
RBC: 5.11 MIL/uL (ref 4.22–5.81)
RDW: 12.5 % (ref 11.5–15.5)
WBC: 9.7 10*3/uL (ref 4.0–10.5)
nRBC: 0 % (ref 0.0–0.2)

## 2023-08-20 LAB — CBG MONITORING, ED: Glucose-Capillary: 115 mg/dL — ABNORMAL HIGH (ref 70–99)

## 2023-08-20 MED ORDER — NALOXONE HCL 2 MG/2ML IJ SOSY
0.4000 mg | PREFILLED_SYRINGE | Freq: Once | INTRAMUSCULAR | Status: AC
Start: 2023-08-20 — End: 2023-08-20
  Administered 2023-08-20: 0.4 mg via INTRAVENOUS
  Filled 2023-08-20: qty 2

## 2023-08-20 NOTE — ED Triage Notes (Addendum)
Pt arrives via [facility] with c/o overdose. [place] around 0630, pt was found initially unresponsive with agonal respirations and given a total of 2 mg narcan before pt woke up and refused EMS transport. EMS later called out for a repeat overdose, pt again responsive to only painful stimuli. Pt given another 2 mg narcan before being transported to the ER, arriving alert to verbal stimuli with consecutive decreases in RR and SPO2. PD accompanying pt. Pt claims he took a Xanax that he got from a friend.
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
## 2023-08-20 NOTE — ED Notes (Signed)
[place] [employer] left patient in the ED, States they will get court order to disclose.

## 2023-08-20 NOTE — ED Notes (Signed)
Called [employer] Dept to inform of pending discharge, under order to disclose from Police department

## 2023-08-20 NOTE — ED Notes (Signed)
Motion to Disclose given to this RN and Diplomatic Services operational officer, Lelani at this time. One copy given to Secretary and one copy placed in pt's chart.   When pt is d/c, please call Deputy on Call [phone] when pt is discharged

## 2023-08-20 NOTE — ED Notes (Signed)
Patient discharged to [facility] custody
